# Patient Record
Sex: Female | Born: 1965 | Race: White | Hispanic: No | Marital: Married | Smoking: Former smoker
Health system: Southern US, Community
[De-identification: ages and names within clinical notes are randomized; demographics above are authoritative.]

## PROBLEM LIST (undated history)

## (undated) DIAGNOSIS — E785 Hyperlipidemia, unspecified: Secondary | ICD-10-CM

## (undated) DIAGNOSIS — M51379 Other intervertebral disc degeneration, lumbosacral region without mention of lumbar back pain or lower extremity pain: Secondary | ICD-10-CM

## (undated) DIAGNOSIS — I10 Essential (primary) hypertension: Secondary | ICD-10-CM

## (undated) DIAGNOSIS — M5137 Other intervertebral disc degeneration, lumbosacral region: Secondary | ICD-10-CM

## (undated) HISTORY — PX: ABDOMINAL HYSTERECTOMY: SHX81

## (undated) HISTORY — PX: NECK SURGERY: SHX720

## (undated) HISTORY — PX: NASAL RECONSTRUCTION WITH SEPTAL REPAIR: SHX5665

---

## 2018-04-12 ENCOUNTER — Encounter: Payer: Self-pay | Admitting: Emergency Medicine

## 2018-04-12 ENCOUNTER — Emergency Department
Admission: EM | Admit: 2018-04-12 | Discharge: 2018-04-12 | Disposition: A | Discharge status: Home / Self Care | Payer: BLUE CROSS/BLUE SHIELD | Attending: Emergency Medicine | Admitting: Emergency Medicine

## 2018-04-12 ENCOUNTER — Other Ambulatory Visit: Payer: Self-pay

## 2018-04-12 ENCOUNTER — Emergency Department: Payer: BLUE CROSS/BLUE SHIELD

## 2018-04-12 DIAGNOSIS — Y929 Unspecified place or not applicable: Secondary | ICD-10-CM | POA: Insufficient documentation

## 2018-04-12 DIAGNOSIS — Y93K1 Activity, walking an animal: Secondary | ICD-10-CM | POA: Diagnosis not present

## 2018-04-12 DIAGNOSIS — S63502A Unspecified sprain of left wrist, initial encounter: Secondary | ICD-10-CM | POA: Diagnosis not present

## 2018-04-12 DIAGNOSIS — S61512A Laceration without foreign body of left wrist, initial encounter: Secondary | ICD-10-CM

## 2018-04-12 DIAGNOSIS — Y998 Other external cause status: Secondary | ICD-10-CM | POA: Insufficient documentation

## 2018-04-12 DIAGNOSIS — I10 Essential (primary) hypertension: Secondary | ICD-10-CM | POA: Diagnosis not present

## 2018-04-12 DIAGNOSIS — W01198A Fall on same level from slipping, tripping and stumbling with subsequent striking against other object, initial encounter: Secondary | ICD-10-CM | POA: Diagnosis not present

## 2018-04-12 DIAGNOSIS — S6992XA Unspecified injury of left wrist, hand and finger(s), initial encounter: Secondary | ICD-10-CM | POA: Diagnosis present

## 2018-04-12 DIAGNOSIS — Z23 Encounter for immunization: Secondary | ICD-10-CM | POA: Insufficient documentation

## 2018-04-12 HISTORY — DX: Essential (primary) hypertension: I10

## 2018-04-12 HISTORY — DX: Hyperlipidemia, unspecified: E78.5

## 2018-04-12 MED ORDER — CEPHALEXIN 500 MG PO CAPS: 1000.0000 mg | ORAL_CAPSULE | Freq: Two times a day (BID) | ORAL | 0 refills | Status: DC

## 2018-04-12 MED ORDER — LIDOCAINE HCL (PF) 1 % IJ SOLN
10.0000 mL | Freq: Once | INTRAMUSCULAR | Status: AC
  Administered 2018-04-12: 10 mL
  Filled 2018-04-12: qty 10

## 2018-04-12 MED ORDER — TETANUS-DIPHTH-ACELL PERTUSSIS 5-2.5-18.5 LF-MCG/0.5 IM SUSP
0.5000 mL | Freq: Once | INTRAMUSCULAR | Status: AC
  Administered 2018-04-12: 0.5 mL via INTRAMUSCULAR
  Filled 2018-04-12: qty 0.5

## 2018-04-12 MED ORDER — LIDOCAINE HCL (PF) 1 % IJ SOLN
INTRAMUSCULAR | Status: AC
  Administered 2018-04-12: 10 mL
  Filled 2018-04-12: qty 5

## 2018-04-12 NOTE — ED Triage Notes (Signed)
Pt to ED from home c/o laceration to left arm while walking dog tonight.

## 2018-04-12 NOTE — ED Provider Notes (Signed)
[facility] Emergency Department Provider Note  ____________________________________________  Time seen: Approximately 9:09 PM  I have reviewed the triage vital signs and the nursing notes.   HISTORY  Chief Complaint Laceration    HPI Shannon Cortez is a 52 y.o. female who presents the emergency department complaining of injury, laceration to the left wrist.  Patient was walking her dog, on a sharp slope with wet leaves.  Dog jerked her forward causing her to fall into a "push."  Patient sustained an injury to the left wrist with laceration to the dorsal aspect.  Patient is unsure of her last tetanus shot.  She is able to move the wrist but doing so increases pain.  Patient had immediate bruising and swelling in addition to laceration.  She did not hit her head or lose consciousness.  No other injury or complaint at this time.    Past Medical History:  Diagnosis Date  . Hyperlipidemia   . Hypertension     There are no active problems to display for this patient.   Past Surgical History:  Procedure Laterality Date  . ABDOMINAL HYSTERECTOMY    . NECK SURGERY      Prior to Admission medications   Medication Sig Start Date End Date Taking? Authorizing Provider  cephALEXin (KEFLEX) 500 MG capsule Take 2 capsules (1,000 mg total) by mouth 2 (two) times daily. 04/12/18   Cuthriell, Delorise Royals, PA-C    Allergies Toradol [ketorolac tromethamine] and Sulfa antibiotics  History reviewed. No pertinent family history.  Social History Social History   Tobacco Use  . Smoking status: Never Smoker  . Smokeless tobacco: Never Used  Substance Use Topics  . Alcohol use: Never    Frequency: Never  . Drug use: Never     Review of Systems  Constitutional: No fever/chills Eyes: No visual changes. No discharge ENT: No upper respiratory complaints. Cardiovascular: no chest pain. Respiratory: no cough. No SOB. Gastrointestinal: No abdominal pain.  No  nausea, no vomiting.   Musculoskeletal: Left wrist injury Skin: Laceration to the left wrist Neurological: Negative for headaches, focal weakness or numbness. 10-point ROS otherwise negative.  ____________________________________________   PHYSICAL EXAM:  VITAL SIGNS: ED Triage Vitals [04/12/18 1955]  Enc Vitals Group     BP (!) 160/77     Pulse Rate 80     Resp 18     Temp 98 F (36.7 C)     Temp Source Oral     SpO2 99 %     Weight 172 lb (78 kg)     Height 5\' 2"  (1.575 m)     Head Circumference      Peak Flow      Pain Score 7     Pain Loc      Pain Edu?      Excl. in GC?      Constitutional: Alert and oriented. Well appearing and in no acute distress. Eyes: Conjunctivae are normal. PERRL. EOMI. Head: Atraumatic. Neck: No stridor.    Cardiovascular: Normal rate, regular rhythm. Normal S1 and S2.  Good peripheral circulation. Respiratory: Normal respiratory effort without tachypnea or retractions. Lungs CTAB. Good air entry to the bases with no decreased or absent breath sounds. Musculoskeletal: Full range of motion to all extremities. No gross deformities appreciated.  Examination of the left wrist reveals mild edema, ecchymosis to the anterior and posterior aspect of the wrist.  Patient has a laceration to the dorsal aspect of the wrist.  This is  linear nature.  Edges are smooth.  No foreign body.  No bleeding.  Laceration measures approximately 5 cm in length.  Patient is able to flex and extend the wrist.  Significant tenderness palpation of the distal radius and ulna with no palpable abnormality.  Radial pulse intact.  Sensation intact all 5 digits. Neurologic:  Normal speech and language. No gross focal neurologic deficits are appreciated.  Skin:  Skin is warm, dry and intact. No rash noted. Psychiatric: Mood and affect are normal. Speech and behavior are normal. Patient exhibits appropriate insight and judgement.   ____________________________________________    LABS (all labs ordered are listed, but only abnormal results are displayed)  Labs Reviewed - No data to display ____________________________________________  EKG   ____________________________________________  RADIOLOGY I personally viewed and evaluated these images as part of my medical decision making, as well as reviewing the written report by the radiologist.  I concur with radiologist finding of no acute osseous abnormality or retained foreign body  Dg Wrist Complete Left  Result Date: 04/12/2018 CLINICAL DATA:  Left posterior wrist pain laceration following a fall today. EXAM: LEFT WRIST - COMPLETE 3+ VIEW COMPARISON:  None. FINDINGS: Shallow linear soft tissue defect in the dorsal aspect of the wrist. Two small infused ossicles distal to the ulnar styloid. No acute fracture or dislocation. IMPRESSION: No acute fracture or dislocation. Electronically Signed   By: Beckie Salts M.D.   On: 04/12/2018 21:36    ____________________________________________    PROCEDURES  Procedure(s) performed:    Marland KitchenMarland KitchenLaceration Repair Date/Time: 04/12/2018 10:20 PM Performed by: Racheal Patches, PA-C Authorized by: Racheal Patches, PA-C   Consent:    Consent obtained:  Verbal   Consent given by:  Patient   Risks discussed:  Pain Anesthesia (see MAR for exact dosages):    Anesthesia method:  Local infiltration   Local anesthetic:  Lidocaine 1% w/o epi Laceration details:    Location:  Shoulder/arm   Shoulder/arm location:  L lower arm   Length (cm):  5 Repair type:    Repair type:  Simple Pre-procedure details:    Preparation:  Patient was prepped and draped in usual sterile fashion and imaging obtained to evaluate for foreign bodies Exploration:    Hemostasis achieved with:  Direct pressure   Wound exploration: wound explored through full range of motion and entire depth of wound probed and visualized     Wound extent: no foreign bodies/material noted, no muscle damage  noted, no nerve damage noted, no tendon damage noted, no underlying fracture noted and no vascular damage noted     Contaminated: no   Treatment:    Area cleansed with:  Betadine   Amount of cleaning:  Standard   Irrigation solution:  Sterile saline   Irrigation volume:  500 ml   Irrigation method:  Syringe Skin repair:    Repair method:  Sutures   Suture size:  4-0   Suture material:  Nylon   Suture technique:  Simple interrupted   Number of sutures:  9 Approximation:    Approximation:  Close Post-procedure details:    Dressing:  Splint for protection   Patient tolerance of procedure:  Tolerated well, no immediate complications      Medications  lidocaine (PF) (XYLOCAINE) 1 % injection 10 mL (10 mLs Infiltration Given 04/12/18 2130)  Tdap (BOOSTRIX) injection 0.5 mL (0.5 mLs Intramuscular Given 04/12/18 2129)     ____________________________________________   INITIAL IMPRESSION / ASSESSMENT AND PLAN / ED COURSE  Pertinent labs & imaging results that were available during my care of the patient were reviewed by me and considered in my medical decision making (see chart for details).  Review of the [place] CSRS was performed in accordance of the [facility] prior to dispensing any controlled drugs.      Patient's diagnosis is consistent with left wrist sprain, laceration to the left wrist.  Patient presented to the emergency department after injuring left wrist.  Wound was closed as described above.  X-ray reveals no acute osseous abnormality or retained foreign body.  Splint for protection after laceration repair.  Antibiotics prophylactically.  Tylenol and Motrin at home as needed for pain.  Follow-up primary care in 1 week for suture removal..  Patient is given ED precautions to return to the ED for any worsening or new symptoms.     ____________________________________________  FINAL CLINICAL IMPRESSION(S) / ED DIAGNOSES  Final diagnoses:  Sprain of left wrist, initial  encounter  Laceration of left wrist, initial encounter      NEW MEDICATIONS STARTED DURING THIS VISIT:  ED Discharge Orders         Ordered    cephALEXin (KEFLEX) 500 MG capsule  2 times daily     04/12/18 2215              This chart was dictated using voice recognition software/Dragon. Despite best efforts to proofread, errors can occur which can change the meaning. Any change was purely unintentional.    Racheal PatchesCuthriell, Jonathan D, PA-C 04/12/18 2222    Rockne MenghiniNorman, Anne-Caroline, MD 04/13/18 [phone]

## 2018-04-15 DIAGNOSIS — E876 Hypokalemia: Secondary | ICD-10-CM | POA: Diagnosis present

## 2018-04-15 DIAGNOSIS — Z79899 Other long term (current) drug therapy: Secondary | ICD-10-CM

## 2018-04-15 DIAGNOSIS — Y93K1 Activity, walking an animal: Secondary | ICD-10-CM

## 2018-04-15 DIAGNOSIS — L03114 Cellulitis of left upper limb: Principal | ICD-10-CM | POA: Diagnosis present

## 2018-04-15 DIAGNOSIS — S61512A Laceration without foreign body of left wrist, initial encounter: Secondary | ICD-10-CM | POA: Diagnosis present

## 2018-04-15 DIAGNOSIS — Z888 Allergy status to other drugs, medicaments and biological substances status: Secondary | ICD-10-CM

## 2018-04-15 DIAGNOSIS — T148XXA Other injury of unspecified body region, initial encounter: Secondary | ICD-10-CM | POA: Diagnosis not present

## 2018-04-15 DIAGNOSIS — M5137 Other intervertebral disc degeneration, lumbosacral region: Secondary | ICD-10-CM | POA: Diagnosis present

## 2018-04-15 DIAGNOSIS — I10 Essential (primary) hypertension: Secondary | ICD-10-CM | POA: Diagnosis present

## 2018-04-15 DIAGNOSIS — Z882 Allergy status to sulfonamides status: Secondary | ICD-10-CM

## 2018-04-15 DIAGNOSIS — W1830XA Fall on same level, unspecified, initial encounter: Secondary | ICD-10-CM | POA: Diagnosis present

## 2018-04-15 DIAGNOSIS — E785 Hyperlipidemia, unspecified: Secondary | ICD-10-CM | POA: Diagnosis present

## 2018-04-15 DIAGNOSIS — R110 Nausea: Secondary | ICD-10-CM | POA: Diagnosis not present

## 2018-04-15 LAB — CBC WITH DIFFERENTIAL/PLATELET
Abs Immature Granulocytes: 0.02 10*3/uL (ref 0.00–0.07)
Basophils Absolute: 0 10*3/uL (ref 0.0–0.1)
Basophils Relative: 1 %
Eosinophils Absolute: 0.2 10*3/uL (ref 0.0–0.5)
Eosinophils Relative: 2 %
HCT: 38.3 % (ref 36.0–46.0)
HEMOGLOBIN: 12.5 g/dL (ref 12.0–15.0)
Immature Granulocytes: 0 %
Lymphocytes Relative: 16 %
Lymphs Abs: 1.3 10*3/uL (ref 0.7–4.0)
MCH: 31.2 pg (ref 26.0–34.0)
MCHC: 32.6 g/dL (ref 30.0–36.0)
MCV: 95.5 fL (ref 80.0–100.0)
Monocytes Absolute: 0.6 10*3/uL (ref 0.1–1.0)
Monocytes Relative: 8 %
Neutro Abs: 5.8 10*3/uL (ref 1.7–7.7)
Neutrophils Relative %: 73 %
Platelets: 209 10*3/uL (ref 150–400)
RBC: 4.01 MIL/uL (ref 3.87–5.11)
RDW: 12.9 % (ref 11.5–15.5)
WBC: 8 10*3/uL (ref 4.0–10.5)
nRBC: 0 % (ref 0.0–0.2)

## 2018-04-15 LAB — COMPREHENSIVE METABOLIC PANEL
ALT: 20 U/L (ref 0–44)
AST: 22 U/L (ref 15–41)
Albumin: 4.1 g/dL (ref 3.5–5.0)
Alkaline Phosphatase: 111 U/L (ref 38–126)
Anion gap: 9 (ref 5–15)
BILIRUBIN TOTAL: 0.5 mg/dL (ref 0.3–1.2)
BUN: 13 mg/dL (ref 6–20)
CALCIUM: 8.8 mg/dL — AB (ref 8.9–10.3)
CO2: 23 mmol/L (ref 22–32)
Chloride: 108 mmol/L (ref 98–111)
Creatinine, Ser: 1.09 mg/dL — ABNORMAL HIGH (ref 0.44–1.00)
GFR calc Af Amer: >60 mL/min (ref >60)
GFR calc non Af Amer: 58 mL/min — ABNORMAL LOW (ref >60)
Glucose, Bld: 108 mg/dL — ABNORMAL HIGH (ref 70–99)
POTASSIUM: 3.4 mmol/L — AB (ref 3.5–5.1)
Sodium: 140 mmol/L (ref 135–145)
Total Protein: 7.3 g/dL (ref 6.5–8.1)

## 2018-04-15 NOTE — ED Triage Notes (Signed)
Patient c/o purulent drainage from laceration on left forearm/wrist. Laceration was repaired at this ED on Monday. Foul smell noted to area. Patient reports the pain has become more severe.

## 2018-04-16 ENCOUNTER — Inpatient Hospital Stay
Admission: EM | Admit: 2018-04-16 | Discharge: 2018-04-19 | DRG: 603 | Disposition: A | Discharge status: Home / Self Care | Payer: BLUE CROSS/BLUE SHIELD | Attending: Internal Medicine | Admitting: Internal Medicine

## 2018-04-16 ENCOUNTER — Other Ambulatory Visit: Payer: Self-pay

## 2018-04-16 DIAGNOSIS — T148XXA Other injury of unspecified body region, initial encounter: Secondary | ICD-10-CM

## 2018-04-16 DIAGNOSIS — L03114 Cellulitis of left upper limb: Secondary | ICD-10-CM

## 2018-04-16 DIAGNOSIS — L089 Local infection of the skin and subcutaneous tissue, unspecified: Secondary | ICD-10-CM

## 2018-04-16 DIAGNOSIS — L0390 Cellulitis, unspecified: Secondary | ICD-10-CM | POA: Diagnosis present

## 2018-04-16 HISTORY — DX: Other intervertebral disc degeneration, lumbosacral region without mention of lumbar back pain or lower extremity pain: M51.379

## 2018-04-16 HISTORY — DX: Other intervertebral disc degeneration, lumbosacral region: M51.37

## 2018-04-16 LAB — HEMOGLOBIN A1C
Hgb A1c MFr Bld: 4.6 % — ABNORMAL LOW (ref 4.8–5.6)
Mean Plasma Glucose: 85.32 mg/dL

## 2018-04-16 LAB — CG4 I-STAT (LACTIC ACID): Lactic Acid, Venous: 1.13 mmol/L (ref 0.5–1.9)

## 2018-04-16 MED ORDER — ONDANSETRON HCL 4 MG/2ML IJ SOLN
4.0000 mg | Freq: Once | INTRAMUSCULAR | Status: AC
  Administered 2018-04-16: 4 mg via INTRAVENOUS
  Filled 2018-04-16: qty 2

## 2018-04-16 MED ORDER — ATENOLOL 25 MG PO TABS
25.0000 mg | ORAL_TABLET | Freq: Every day | ORAL | Status: DC
  Administered 2018-04-16 – 2018-04-19 (×4): 25 mg via ORAL
  Filled 2018-04-16 (×4): qty 1

## 2018-04-16 MED ORDER — SODIUM CHLORIDE 0.9 % IV BOLUS
1000.0000 mL | Freq: Once | INTRAVENOUS | Status: AC
  Administered 2018-04-16: 1000 mL via INTRAVENOUS

## 2018-04-16 MED ORDER — CEFAZOLIN SODIUM-DEXTROSE 1-4 GM/50ML-% IV SOLN
1.0000 g | Freq: Once | INTRAVENOUS | Status: AC
  Administered 2018-04-16: 1 g via INTRAVENOUS
  Filled 2018-04-16: qty 50

## 2018-04-16 MED ORDER — SODIUM CHLORIDE 0.9 % IV SOLN
INTRAVENOUS | Status: DC | PRN
  Administered 2018-04-16 – 2018-04-18 (×2): 500 mL via INTRAVENOUS

## 2018-04-16 MED ORDER — ACETAMINOPHEN 650 MG RE SUPP: 650.0000 mg | Freq: Four times a day (QID) | RECTAL | Status: DC | PRN

## 2018-04-16 MED ORDER — SODIUM CHLORIDE 0.9 % IV SOLN
2.0000 g | INTRAVENOUS | Status: DC
  Administered 2018-04-16 – 2018-04-18 (×3): 2 g via INTRAVENOUS
  Filled 2018-04-16 (×3): qty 2
  Filled 2018-04-16: qty 20

## 2018-04-16 MED ORDER — DOCUSATE SODIUM 100 MG PO CAPS
100.0000 mg | ORAL_CAPSULE | Freq: Two times a day (BID) | ORAL | Status: DC
  Administered 2018-04-16 – 2018-04-19 (×6): 100 mg via ORAL
  Filled 2018-04-16 (×6): qty 1

## 2018-04-16 MED ORDER — OXYCODONE HCL 5 MG PO TABS
5.0000 mg | ORAL_TABLET | ORAL | Status: DC | PRN
  Administered 2018-04-16: 10 mg via ORAL
  Administered 2018-04-16: 5 mg via ORAL
  Administered 2018-04-17: 10 mg via ORAL
  Filled 2018-04-16 (×2): qty 2
  Filled 2018-04-16: qty 1

## 2018-04-16 MED ORDER — ATORVASTATIN CALCIUM 20 MG PO TABS
20.0000 mg | ORAL_TABLET | Freq: Every day | ORAL | Status: DC
  Administered 2018-04-17 – 2018-04-19 (×3): 20 mg via ORAL
  Filled 2018-04-16 (×3): qty 1

## 2018-04-16 MED ORDER — VANCOMYCIN HCL IN DEXTROSE 1-5 GM/200ML-% IV SOLN
1000.0000 mg | INTRAVENOUS | Status: DC
  Administered 2018-04-16 – 2018-04-18 (×3): 1000 mg via INTRAVENOUS
  Filled 2018-04-16 (×4): qty 200

## 2018-04-16 MED ORDER — MORPHINE SULFATE (PF) 2 MG/ML IV SOLN
2.0000 mg | INTRAVENOUS | Status: DC | PRN
  Administered 2018-04-17 (×2): 2 mg via INTRAVENOUS
  Filled 2018-04-16 (×3): qty 1

## 2018-04-16 MED ORDER — ONDANSETRON HCL 4 MG PO TABS
4.0000 mg | ORAL_TABLET | Freq: Four times a day (QID) | ORAL | Status: DC | PRN
  Administered 2018-04-17 – 2018-04-18 (×2): 4 mg via ORAL
  Filled 2018-04-16 (×2): qty 1

## 2018-04-16 MED ORDER — ACETAMINOPHEN 325 MG PO TABS
650.0000 mg | ORAL_TABLET | Freq: Four times a day (QID) | ORAL | Status: DC | PRN
  Administered 2018-04-16: 650 mg via ORAL
  Filled 2018-04-16 (×2): qty 2

## 2018-04-16 MED ORDER — ONDANSETRON HCL 4 MG/2ML IJ SOLN
4.0000 mg | Freq: Four times a day (QID) | INTRAMUSCULAR | Status: DC | PRN
  Filled 2018-04-16: qty 2

## 2018-04-16 MED ORDER — MORPHINE SULFATE (PF) 4 MG/ML IV SOLN
4.0000 mg | Freq: Once | INTRAVENOUS | Status: AC
  Administered 2018-04-16: 4 mg via INTRAVENOUS
  Filled 2018-04-16: qty 1

## 2018-04-16 MED ORDER — METHOCARBAMOL 500 MG PO TABS
500.0000 mg | ORAL_TABLET | Freq: Two times a day (BID) | ORAL | Status: DC
  Administered 2018-04-16 – 2018-04-19 (×7): 500 mg via ORAL
  Filled 2018-04-16 (×7): qty 1

## 2018-04-16 MED ORDER — VANCOMYCIN HCL IN DEXTROSE 1-5 GM/200ML-% IV SOLN
1000.0000 mg | Freq: Once | INTRAVENOUS | Status: AC
  Administered 2018-04-16: 1000 mg via INTRAVENOUS
  Filled 2018-04-16: qty 200

## 2018-04-16 MED ORDER — POTASSIUM CHLORIDE CRYS ER 20 MEQ PO TBCR
40.0000 meq | EXTENDED_RELEASE_TABLET | ORAL | Status: AC
  Administered 2018-04-16: 40 meq via ORAL
  Filled 2018-04-16: qty 2

## 2018-04-16 NOTE — Plan of Care (Signed)

## 2018-04-16 NOTE — ED Notes (Signed)
[facility] called to RN taking patient in room 12. Patient to be transferred to room 133.

## 2018-04-16 NOTE — ED Notes (Signed)
Pt with increased swelling and pain; redness noted extended from hand up into FA; charge nurse called for exam room

## 2018-04-16 NOTE — ED Provider Notes (Signed)
[facility] Emergency Department Provider Note  ____________________________________________   First MD Initiated Contact with Patient 04/16/18 [phone]     (approximate)  I have reviewed the triage vital signs and the nursing notes.   HISTORY  Chief Complaint Wound Check    HPI Shannon Cortez is a 51 y.o. female with no contributory chronic medical issues who presents for evaluation of a wound infection.  She was seen in the emergency department about 4 days ago for a laceration to the dorsal aspect of her left wrist after a fall while walking her dog.  The wound has remained closed but over the last couple of days it has become painful and is started to get red around the sutures.  As of this evening she just lifted up her arm and a large amount of pus squirted out of the wound.  It was starting to swell and the pain was becoming severe so her husband convinced her to come to the emergency department.  Due to very high volumes in the department tonight, they waited for a room for about 3-1/2 hours.  During that time the patient's arm began to swell more in the redness has spread from the left wrist to the level of the MCPs on her hand and up more than midway on her forearm.  Moving the arm and touching it make it worse and nothing particular makes it better.  She denies fever/chills, chest pain, shortness of breath, nausea, vomiting, and abdominal pain.  The pain and purulence from the arm is severe.  She received a Tdap when she was originally seen and was started on Keflex which she has been taking.  Past Medical History:  Diagnosis Date  . DDD (degenerative disc disease), lumbosacral   . Hyperlipidemia   . Hypertension     Patient Active Problem List   Diagnosis Date Noted  . Cellulitis 04/16/2018    Past Surgical History:  Procedure Laterality Date  . ABDOMINAL HYSTERECTOMY    . NASAL RECONSTRUCTION WITH SEPTAL REPAIR    . NECK SURGERY      Prior  to Admission medications   Medication Sig Start Date End Date Taking? Authorizing Provider  atenolol (TENORMIN) 25 MG tablet Take 25 mg by mouth daily.   Yes [provider]  atorvastatin (LIPITOR) 20 MG tablet Take 20 mg by mouth daily.   Yes [provider]  cephALEXin (KEFLEX) 500 MG capsule Take 2 capsules (1,000 mg total) by mouth 2 (two) times daily. 04/12/18  Yes Cuthriell, Delorise Royals, PA-C  ibuprofen (ADVIL,MOTRIN) 800 MG tablet Take 800 mg by mouth every 8 (eight) hours as needed for moderate pain.   Yes [provider]  methocarbamol (ROBAXIN) 500 MG tablet Take 500 mg by mouth 2 (two) times daily.   Yes [provider]    Allergies Toradol [ketorolac tromethamine] and Sulfa antibiotics  Family History  Problem Relation Age of Onset  . Hepatitis Mother     Social History Social History   Tobacco Use  . Smoking status: Never Smoker  . Smokeless tobacco: Never Used  Substance Use Topics  . Alcohol use: Never    Frequency: Never  . Drug use: Never    Review of Systems constitutional: No fever/chills Eyes: No visual changes. ENT: No sore throat. Cardiovascular: Denies chest pain. Respiratory: Denies shortness of breath. Gastrointestinal: No abdominal pain.  No nausea, no vomiting.  No diarrhea.  No constipation. Genitourinary: Negative for dysuria. Musculoskeletal: Pain, swelling, and  purulent drainage from the sutured wound on her left wrist as described above Integumentary: Erythema spreading throughout the left forearm and onto the left hand Neurological: Negative for headaches, focal weakness or numbness.   ____________________________________________   PHYSICAL EXAM:  VITAL SIGNS: ED Triage Vitals  Enc Vitals Group     BP 04/15/18 2302 (!) 162/85     Pulse Rate 04/15/18 2302 70     Resp 04/15/18 2302 20     Temp 04/15/18 2302 97.6 F (36.4 C)     Temp Source 04/15/18 2302 Oral     SpO2 04/15/18 2302 95 %     Weight  04/15/18 2303 79 kg (174 lb 2.6 oz)     Height --      Head Circumference --      Peak Flow --      Pain Score 04/15/18 2303 8     Pain Loc --      Pain Edu? --      Excl. in GC? --     Constitutional: Alert and oriented. Well appearing and in no acute distress. Eyes: Conjunctivae are normal.  Head: Atraumatic. Nose: No congestion/rhinnorhea. Mouth/Throat: Mucous membranes are moist. Neck: No stridor.  No meningeal signs.   Cardiovascular: Normal rate, regular rhythm. Good peripheral circulation. Grossly normal heart sounds. Respiratory: Normal respiratory effort.  No retractions. Lungs CTAB. Gastrointestinal: Soft and nontender. No distention.  Musculoskeletal: There is edema around the wound of the dorsal aspect of the left wrist and extending several centimeters proximally and distally to the hand.  See skin exam below.  In spite of the cellulitis on the hand she is able to squeeze and flex and extend her fingers without difficulty.  No evidence of flexor tenosynovitis. Neurologic:  Normal speech and language. No gross focal neurologic deficits are appreciated.  Psychiatric: Mood and affect are normal. Speech and behavior are normal. Skin:  Skin is warm and dry.  There is erythema on the dorsal aspect of the arm extending more than halfway towards the elbow on the left forearm and down to the MCPs of the the hand.  The total length of the cellulitis extends at least 20 cm on the arm.  There is no wound dehiscence, but there is purulent material draining from between the sutures.  See photo below (purulence had just been wiped away):      ____________________________________________   LABS (all labs ordered are listed, but only abnormal results are displayed)  Labs Reviewed  COMPREHENSIVE METABOLIC PANEL - Abnormal; Notable for the following components:      Result Value   Potassium 3.4 (*)    Glucose, Bld 108 (*)    Creatinine, Ser 1.09 (*)    Calcium 8.8 (*)    GFR calc  non Af Amer 58 (*)    All other components within normal limits  CULTURE, BLOOD (ROUTINE X 2)  CULTURE, BLOOD (ROUTINE X 2)  AEROBIC/ANAEROBIC CULTURE (SURGICAL/DEEP WOUND)  CBC WITH DIFFERENTIAL/PLATELET  I-STAT CG4 LACTIC ACID, ED  CG4 I-STAT (LACTIC ACID)  POC URINE PREG, ED   ____________________________________________  EKG  None - EKG not ordered by ED physician ____________________________________________  RADIOLOGY   ED MD interpretation: No indication for imaging  Official radiology report(s): No results found.  ____________________________________________   PROCEDURES  Critical Care performed: No   Procedure(s) performed:   Procedures   ____________________________________________   INITIAL IMPRESSION / ASSESSMENT AND PLAN / ED COURSE  As part of my medical decision making, I  reviewed the following data within the electronic MEDICAL RECORD NUMBER Nursing notes reviewed and incorporated, Labs reviewed , Old chart reviewed, Discussed with admitting physician  and Notes from prior ED visits    Differential diagnosis includes, but is not limited to, wound infection, cellulitis, necrotizing fasciitis.  Fortunately the patient is well-appearing and in no acute distress with normal vital signs.  She does not meet septic criteria.  Her lab work is within normal limits including no leukocytosis and a normal lactic acid.  However, her physical exam is concerning and over the last 4 hours she has gone from a localized wound infection to having spreading cellulitis covering more than 20 cm of her left arm.  Blood cultures have been sent.  I have placed an IV and am giving her Ancef 1 g IV and vancomycin 1 g IV for empiric antibiotic treatment.  Prior to antibiotic administration (although she has been on Keflex at home and this is a failure of outpatient treatment), I obtained a wound culture by removing the sutures and expressing a copious amount of purulent material from  the wound.  It was then irrigated with saline.  The patient's pain is relatively mild in circumstances and there is no crepitus.  I am not concerned about necrotizing fasciitis at this time and there is no indication for further imaging.  I discussed the case with the hospitalist for admission for IV antibiotics, failure of outpatient antibiotics, and wound consult.  The patient and her husband understand and agree with the plan.  Clinical Course as of Apr 16 545  Fri Apr 16, 2018  0410 Lactic Acid, Venous: 1.13 [CF]    Clinical Course User Index [CF] Loleta RoseForbach, Nona Gracey, MD    ____________________________________________  FINAL CLINICAL IMPRESSION(S) / ED DIAGNOSES  Final diagnoses:  Wound infection  Cellulitis of left arm     MEDICATIONS GIVEN DURING THIS VISIT:  Medications  ceFAZolin (ANCEF) IVPB 1 g/50 mL premix (1 g Intravenous New Bag/Given 04/16/18 0526)  potassium chloride SA (K-DUR,KLOR-CON) CR tablet 40 mEq (has no administration in time range)  vancomycin (VANCOCIN) IVPB 1000 mg/200 mL premix (has no administration in time range)  sodium chloride 0.9 % bolus 1,000 mL (0 mLs Intravenous Stopped 04/16/18 0523)  vancomycin (VANCOCIN) IVPB 1000 mg/200 mL premix (0 mg Intravenous Stopped 04/16/18 0521)  morphine 4 MG/ML injection 4 mg (4 mg Intravenous Given 04/16/18 0405)  ondansetron (ZOFRAN) injection 4 mg (4 mg Intravenous Given 04/16/18 0402)     ED Discharge Orders    None       Note:  This document was prepared using Dragon voice recognition software and may include unintentional dictation errors.    Loleta RoseForbach, Kamee Bobst, MD 04/16/18 [phone]

## 2018-04-16 NOTE — Progress Notes (Addendum)
Patient admitted early this morning.  Seen and examined by me later in the morning.  She feels like the redness of her left wrist has improved with antibiotics.  She endorses a lot of left wrist pain.  She failed outpatient treatment with Keflex.  On exam, dry bandage in place over left wrist.  No erythema noted of the visible skin.  -Agree with admission H&P -Continue vancomycin -Add ceftriaxone due to wound cultures growing gram negative rods -Follow-up wound cultures -Seen by orthopedic surgery, who did not recommend surgical intervention at this time  Shannon CarolKaty Dalis Beers, MD [facility]

## 2018-04-16 NOTE — Consult Note (Addendum)
SURGICAL CONSULTATION NOTE   HISTORY OF PRESENT ILLNESS (HPI):  52 y.o. female presented to [facility] ED for evaluation of left wrist wound. Patient reports felt from her feet on Monday 04/12/18 and she thinks her dog step on her hand after she felt and that's how she had the laceration. She came to ED were the wound was cleaned and closed. Yesterday she started getting significant swelling and draining purulent secretions through the wound. She refers significant pain on the wrist over the wound that extend and radiates to the distal hand, finger and proximally to the forearm. She refers pain on closing her hand and extending the wrist. Denies fever or chills.  Surgery is consulted by Dr. Sheryle Hail in this context for evaluation and management of infected wound.  PAST MEDICAL HISTORY (PMH):  Past Medical History:  Diagnosis Date  . DDD (degenerative disc disease), lumbosacral   . Hyperlipidemia   . Hypertension      PAST SURGICAL HISTORY (PSH):  Past Surgical History:  Procedure Laterality Date  . ABDOMINAL HYSTERECTOMY    . NASAL RECONSTRUCTION WITH SEPTAL REPAIR    . NECK SURGERY       MEDICATIONS:  Prior to Admission medications   Medication Sig Start Date End Date Taking? Authorizing Provider  atenolol (TENORMIN) 25 MG tablet Take 25 mg by mouth daily.   Yes [provider]  atorvastatin (LIPITOR) 20 MG tablet Take 20 mg by mouth daily.   Yes [provider]  cephALEXin (KEFLEX) 500 MG capsule Take 2 capsules (1,000 mg total) by mouth 2 (two) times daily. 04/12/18  Yes Cuthriell, Delorise Royals, PA-C  ibuprofen (ADVIL,MOTRIN) 800 MG tablet Take 800 mg by mouth every 8 (eight) hours as needed for moderate pain.   Yes [provider]  methocarbamol (ROBAXIN) 500 MG tablet Take 500 mg by mouth 2 (two) times daily.   Yes [provider]     ALLERGIES:  Allergies  Allergen Reactions  . Toradol [Ketorolac Tromethamine] Itching  . Sulfa Antibiotics Rash      SOCIAL HISTORY:  Social History   Socioeconomic History  . Marital status: Married    Spouse name: Not on file  . Number of children: Not on file  . Years of education: Not on file  . Highest education level: Not on file  Occupational History  . Not on file  Social Needs  . Financial resource strain: Not on file  . Food insecurity:    Worry: Not on file    Inability: Not on file  . Transportation needs:    Medical: Not on file    Non-medical: Not on file  Tobacco Use  . Smoking status: Never Smoker  . Smokeless tobacco: Never Used  Substance and Sexual Activity  . Alcohol use: Never    Frequency: Never  . Drug use: Never  . Sexual activity: Not on file  Lifestyle  . Physical activity:    Days per week: Not on file    Minutes per session: Not on file  . Stress: Not on file  Relationships  . Social connections:    Talks on phone: Not on file    Gets together: Not on file    Attends religious service: Not on file    Active member of club or organization: Not on file    Attends meetings of clubs or organizations: Not on file    Relationship status: Not on file  . Intimate partner violence:    Fear of current  or ex partner: Not on file    Emotionally abused: Not on file    Physically abused: Not on file    Forced sexual activity: Not on file  Other Topics Concern  . Not on file  Social History Narrative  . Not on file    The patient currently resides (home / rehab facility / nursing home): Home The patient normally is (ambulatory / bedbound): Ambulatory   FAMILY HISTORY:  Family History  Problem Relation Age of Onset  . Hepatitis Mother      REVIEW OF SYSTEMS:  Constitutional: denies weight loss, fever, chills, or sweats  Eyes: denies any other vision changes, history of eye injury  ENT: denies sore throat, hearing problems  Respiratory: denies shortness of breath, wheezing  Cardiovascular: denies chest pain, palpitations  Gastrointestinal: denies  abdominal pain, N/V, or diarrhea Genitourinary: denies burning with urination or urinary frequency Musculoskeletal: positive for pain on the left wrist Skin: positive for cellulitis and swelling of left wrist and hand  Neurological: denies any other headache, dizziness, weakness  Psychiatric: denies any other depression, anxiety   All other review of systems were negative   VITAL SIGNS:  Temp:  [97.6 F (36.4 C)-98.2 F (36.8 C)] 98.2 F (36.8 C) (12/13 0724) Pulse Rate:  [64-74] 74 (12/13 0724) Resp:  [16-20] 16 (12/13 0651) BP: (116-166)/(61-143) 124/77 (12/13 0724) SpO2:  [94 %-100 %] 94 % (12/13 0724) Weight:  [79 kg] 79 kg (12/12 2303)       Weight: 79 kg BMI (Calculated): 31.85   INTAKE/OUTPUT:  This shift: No intake/output data recorded.  Last 2 shifts: @IOLAST2SHIFTS @   PHYSICAL EXAM:  Constitutional:  -- Normal body habitus  -- Awake, alert, and oriented x3  Eyes:  -- Pupils equally round and reactive to light  -- No scleral icterus  Ear, nose, and throat:  -- No jugular venous distension  Pulmonary:  -- No crackles  -- Equal breath sounds bilaterally -- Breathing non-labored at rest Cardiovascular:  -- S1, S2 present  -- No pericardial rubs Gastrointestinal:  -- Abdomen soft, nontender, non-distended, no guarding or rebound tenderness -- No abdominal masses appreciated, pulsatile or otherwise  Musculoskeletal and Integumentary:  -- Wounds: left wrist she has two wounds that superficially does not has significant cellulitis. There is swelling and tenderness to palpation on the left wrist and the hand. There is pain on passive flexion and extension of the left wrist. There is pain closing her hand.  -- Extremities: Left upper extremity as described on musculoskeletal. The rest of the extremities are normal.   Neurologic:  -- Motor function: intact and symmetric -- Sensation: intact and symmetric   Labs:  CBC Latest Ref Rng & Units 04/15/2018  WBC 4.0 -  10.5 K/uL 8.0  Hemoglobin 12.0 - 15.0 g/dL 78.2  Hematocrit 95.6 - 46.0 % 38.3  Platelets 150 - 400 K/uL 209   CMP Latest Ref Rng & Units 04/15/2018  Glucose 70 - 99 mg/dL 213(Y)  BUN 6 - 20 mg/dL 13  Creatinine 8.65 - 7.84 mg/dL 6.96(E)  Sodium 952 - 841 mmol/L 140  Potassium 3.5 - 5.1 mmol/L 3.4(L)  Chloride 98 - 111 mmol/L 108  CO2 22 - 32 mmol/L 23  Calcium 8.9 - 10.3 mg/dL 3.2(G)  Total Protein 6.5 - 8.1 g/dL 7.3  Total Bilirubin 0.3 - 1.2 mg/dL 0.5  Alkaline Phos 38 - 126 U/L 111  AST 15 - 41 U/L 22  ALT 0 - 44 U/L 20  Imaging studies:  EXAM: LEFT WRIST - COMPLETE 3+ VIEW  COMPARISON:  None.  FINDINGS: Shallow linear soft tissue defect in the dorsal aspect of the wrist. Two small infused ossicles distal to the ulnar styloid. No acute fracture or dislocation.  IMPRESSION: No acute fracture or dislocation.   Electronically Signed   By: Beckie SaltsSteven  Reid M.D.   On: 04/12/2018 21:36  Assessment/Plan:  52 y.o. female with left wrist infected wound, complicated by pertinent comorbidities including hypertension and hyperlipidemia. Upon evaluation, the patient has a left wrist laceration that was closed on the day of the trauma and got infected. The wound was opened in the ED and significant amount of pus was drained. Superficially, the patient seem to have responded to the opening of the wound and the dose of IV antibiotic, but I am concerned of a deeper infection of extensor tenosynovitis. This wound was closed for 4 days and the pus started to drain yesterday. With the physical finding of pain on extending the wrist and moving the fingers, I consider that the patient should be evaluation by Ortho to rule out tenosynovitis. If tenosynovitis is ruled out I will continue following the patient and helping with the management of the infected wound. Agree with Vancomycin. If patient does not respond to current antibiotic therapy, consider changing to a third generation  cephalosporin. I oriented the patient to keep the left upper extremity elevated resting with a pillow. Anti inflammatory medications can help to to improve inflammation.  No fracture was seen in the xray.   All of the above findings and recommendations were discussed with the patient and her husband who was bedside, and all questions were answered to their expressed satisfaction.   Gae GallopEdgardo Cintrn-Daz, MD

## 2018-04-16 NOTE — H&P (Addendum)
Shannon Cortez is an 52 y.o. female.   Chief Complaint: Wound drainage HPI: Patient with past medical history of hypertension and hyperlipidemia presents to the emergency department for recheck of the wound that was sutured 4 days ago.  The patient was having a dressing change and when her husband tried to place her stabilizer brace on the affected wrist she reports a small explosion of pus.  The patient reports that she has been nauseous and has had some chills but denies fever or vomiting.  In the emergency department the patient's wrist was oozing a mixture of pus and blood and her hand was swollen to just below the knuckles.  The wound was cultured and sutures removed prior to initiation of antibiotics.  The hospitalist staff was called for further management  Past Medical History:  Diagnosis Date  . DDD (degenerative disc disease), lumbosacral   . Hyperlipidemia   . Hypertension     Past Surgical History:  Procedure Laterality Date  . ABDOMINAL HYSTERECTOMY    . NASAL RECONSTRUCTION WITH SEPTAL REPAIR    . NECK SURGERY      Family History  Problem Relation Age of Onset  . Hepatitis Mother    Social History:  reports that she has never smoked. She has never used smokeless tobacco. She reports that she does not drink alcohol or use drugs.  Allergies:  Allergies  Allergen Reactions  . Toradol [Ketorolac Tromethamine] Itching  . Sulfa Antibiotics Rash    Medications Prior to Admission  Medication Sig Dispense Refill  . atenolol (TENORMIN) 25 MG tablet Take 25 mg by mouth daily.    Marland Kitchen atorvastatin (LIPITOR) 20 MG tablet Take 20 mg by mouth daily.    . cephALEXin (KEFLEX) 500 MG capsule Take 2 capsules (1,000 mg total) by mouth 2 (two) times daily. 28 capsule 0  . ibuprofen (ADVIL,MOTRIN) 800 MG tablet Take 800 mg by mouth every 8 (eight) hours as needed for moderate pain.    . methocarbamol (ROBAXIN) 500 MG tablet Take 500 mg by mouth 2 (two) times daily.      Results for  orders placed or performed during the hospital encounter of 04/16/18 (from the past 48 hour(s))  Comprehensive metabolic panel     Status: Abnormal   Collection Time: 04/15/18 11:13 PM  Result Value Ref Range   Sodium 140 135 - 145 mmol/L   Potassium 3.4 (L) 3.5 - 5.1 mmol/L   Chloride 108 98 - 111 mmol/L   CO2 23 22 - 32 mmol/L   Glucose, Bld 108 (H) 70 - 99 mg/dL   BUN 13 6 - 20 mg/dL   Creatinine, Ser 4.09 (H) 0.44 - 1.00 mg/dL   Calcium 8.8 (L) 8.9 - 10.3 mg/dL   Total Protein 7.3 6.5 - 8.1 g/dL   Albumin 4.1 3.5 - 5.0 g/dL   AST 22 15 - 41 U/L   ALT 20 0 - 44 U/L   Alkaline Phosphatase 111 38 - 126 U/L   Total Bilirubin 0.5 0.3 - 1.2 mg/dL   GFR calc non Af Amer 58 (L) >60 mL/min   GFR calc Af Amer >60 >60 mL/min   Anion gap 9 5 - 15    Comment: Performed at [facility], [address] [place], [place] [address]  CBC with Differential     Status: None   Collection Time: 04/15/18 11:13 PM  Result Value Ref Range   WBC 8.0 4.0 - 10.5 K/uL   RBC 4.01 3.87 -  5.11 MIL/uL   Hemoglobin 12.5 12.0 - 15.0 g/dL   HCT 16.138.3 09.636.0 - 04.546.0 %   MCV 95.5 80.0 - 100.0 fL   MCH 31.2 26.0 - 34.0 pg   MCHC 32.6 30.0 - 36.0 g/dL   RDW 40.912.9 81.111.5 - 91.415.5 %   Platelets 209 150 - 400 K/uL   nRBC 0.0 0.0 - 0.2 %   Neutrophils Relative % 73 %   Neutro Abs 5.8 1.7 - 7.7 K/uL   Lymphocytes Relative 16 %   Lymphs Abs 1.3 0.7 - 4.0 K/uL   Monocytes Relative 8 %   Monocytes Absolute 0.6 0.1 - 1.0 K/uL   Eosinophils Relative 2 %   Eosinophils Absolute 0.2 0.0 - 0.5 K/uL   Basophils Relative 1 %   Basophils Absolute 0.0 0.0 - 0.1 K/uL   Immature Granulocytes 0 %   Abs Immature Granulocytes 0.02 0.00 - 0.07 K/uL    Comment: Performed at [facility], [address] [place], [place] [address]  Blood culture (routine x 2)     Status: None (Preliminary result)   Collection Time: 04/15/18 11:13 PM  Result Value Ref Range   Specimen Description BLOOD RIGHT HAND    Special Requests       BOTTLES DRAWN AEROBIC AND ANAEROBIC Blood Culture adequate volume   Culture      NO GROWTH < 12 HOURS Performed at [facility], [address] [place], [place] [address]    Report Status PENDING   CG4 I-STAT (Lactic acid)     Status: None   Collection Time: 04/16/18  4:03 AM  Result Value Ref Range   Lactic Acid, Venous 1.13 0.5 - 1.9 mmol/L   No results found.  Review of Systems  Constitutional: Negative for chills and fever.  HENT: Negative for sore throat and tinnitus.   Eyes: Negative for blurred vision and redness.  Respiratory: Negative for cough and shortness of breath.   Cardiovascular: Negative for chest pain, palpitations, orthopnea and PND.  Gastrointestinal: Positive for nausea. Negative for abdominal pain, diarrhea and vomiting.  Genitourinary: Negative for dysuria, frequency and urgency.  Musculoskeletal: Negative for joint pain and myalgias.  Skin: Negative for rash.       No lesions  Neurological: Negative for speech change, focal weakness and weakness.  Endo/Heme/Allergies: Does not bruise/bleed easily.       No temperature intolerance  Psychiatric/Behavioral: Negative for depression and suicidal ideas.    Blood pressure 124/77, pulse 74, temperature 98.2 F (36.8 C), temperature source Oral, resp. rate 16, weight 79 kg, SpO2 94 %. Physical Exam  Vitals reviewed. Constitutional: She is oriented to person, place, and time. She appears well-developed and well-nourished. No distress.  HENT:  Head: Normocephalic and atraumatic.  Mouth/Throat: Oropharynx is clear and moist.  Eyes: Pupils are equal, round, and reactive to light. Conjunctivae and EOM are normal. No scleral icterus.  Neck: Normal range of motion. Neck supple. No JVD present. No tracheal deviation present. No thyromegaly present.  Cardiovascular: Normal rate, regular rhythm and normal heart sounds. Exam reveals no gallop and no friction rub.  No murmur heard. Respiratory: Effort  normal and breath sounds normal.  GI: Soft. Bowel sounds are normal. She exhibits no distension. There is no abdominal tenderness.  Genitourinary:    Genitourinary Comments: Deferred   Musculoskeletal: Normal range of motion.        General: No edema.  Lymphadenopathy:    She has no cervical adenopathy.  Neurological: She  is alert and oriented to person, place, and time. No cranial nerve deficit. She exhibits normal muscle tone.  Skin: Skin is warm and dry. No rash noted. No erythema.  Psychiatric: She has a normal mood and affect. Her behavior is normal. Judgment and thought content normal.     Assessment/Plan This is a 52 year old female admitted for cellulitis. 1.  Cellulitis: Left wrist; sutures removed and purulent drainage expressed. No signs or symptoms of sepsis.  Continue Ancef and vancomycin. 2.  Hypertension: Controlled; continue atenolol 3.  Hyperlipidemia: Continue statin therapy 4.  Hypokalemia: Replete potassium. 5.  DVT prophylaxis: The patient is ambulatory 6.  GI prophylaxis: None The patient is a full code.  Time spent on admission orders and patient care approximately 45 minutes  Arnaldo Natal, MD 04/16/2018, 7:40 AM

## 2018-04-16 NOTE — Consult Note (Signed)
ORTHOPAEDIC CONSULTATION  PATIENT NAME: Shannon Cortez DOB: 1965-11-28  MRN: [id number]  REQUESTING PHYSICIAN: Mayo, Allyn Kenner, MD  Chief Complaint: Left hand and wrist pain  HPI: Shannon Cortez is a 52 y.o. right-hand-dominant female who sustained a fall 4 days ago and sustained a laceration to the dorsum of the left wrist/forearm.  She presented to [facility] Emergency Department at which time the lacerations were cleaned and sutured.  She received tetanus prophylaxis at that time and was started on Keflex twice daily.  Yesterday her husband was changing the dressing in had frank pus expressed from the laceration site.  The patient denies any fevers but has had some chills and has felt somewhat nauseous.  She noted that redness was extending towards the hand and up the dorsum of the forearm last night when she presented to the Emergency Department.  The erythema has improved since the initiation of IV antibiotics.  Past Medical History:  Diagnosis Date  . DDD (degenerative disc disease), lumbosacral   . Hyperlipidemia   . Hypertension    Past Surgical History:  Procedure Laterality Date  . ABDOMINAL HYSTERECTOMY    . NASAL RECONSTRUCTION WITH SEPTAL REPAIR    . NECK SURGERY     Social History   Socioeconomic History  . Marital status: Married    Spouse name: Not on file  . Number of children: Not on file  . Years of education: Not on file  . Highest education level: Not on file  Occupational History  . Not on file  Social Needs  . Financial resource strain: Not on file  . Food insecurity:    Worry: Not on file    Inability: Not on file  . Transportation needs:    Medical: Not on file    Non-medical: Not on file  Tobacco Use  . Smoking status: Never Smoker  . Smokeless tobacco: Never Used  Substance and Sexual Activity  . Alcohol use: Never    Frequency: Never  . Drug use: Never  . Sexual activity: Not on file  Lifestyle  . Physical activity:    Days per  week: Not on file    Minutes per session: Not on file  . Stress: Not on file  Relationships  . Social connections:    Talks on phone: Not on file    Gets together: Not on file    Attends religious service: Not on file    Active member of club or organization: Not on file    Attends meetings of clubs or organizations: Not on file    Relationship status: Not on file  Other Topics Concern  . Not on file  Social History Narrative  . Not on file   Family History  Problem Relation Age of Onset  . Hepatitis Mother    Allergies  Allergen Reactions  . Toradol [Ketorolac Tromethamine] Itching  . Sulfa Antibiotics Rash   Prior to Admission medications   Medication Sig Start Date End Date Taking? Authorizing Provider  atenolol (TENORMIN) 25 MG tablet Take 25 mg by mouth daily.   Yes [provider]  atorvastatin (LIPITOR) 20 MG tablet Take 20 mg by mouth daily.   Yes [provider]  cephALEXin (KEFLEX) 500 MG capsule Take 2 capsules (1,000 mg total) by mouth 2 (two) times daily. 04/12/18  Yes Cuthriell, Delorise Royals, PA-C  ibuprofen (ADVIL,MOTRIN) 800 MG tablet Take 800 mg by mouth every 8 (eight) hours as needed for moderate pain.   Yes [provider]  methocarbamol (ROBAXIN) 500 MG tablet Take 500 mg by mouth 2 (two) times daily.   Yes [provider]   No results found.  Positive ROS: All other systems have been reviewed and were otherwise negative with the exception of those mentioned in the HPI and as above.  Physical Exam: General: Well developed, well nourished female seen in no acute distress. Skin: 2 horizontal lacerations are noted proximal to the dorsal wrist crease.  Slight erythema is noted to the site.  There is also some soft tissue swelling and ecchymosis to the volar surface of the wrist. Neurologic: Awake, alert, and oriented. Sensory function is grossly intact. Motor strength is felt to be 5 over 5 bilaterally. No clonus or tremor.  Good motor coordination. Lymphatic: No axillary or cervical lymphadenopathy  MUSCULOSKELETAL: Examination of the left upper extremity shows only minimal erythema near the lacerations/abrasions.  Gentle wrist flexion and extension is well-tolerated.  The patient demonstrates reasonably good range of motion of the digits actively.  Some difficulty is noted with attempts at creating a clenched fist.  There is tenderness to palpation on the volar surface of the wrist in the area of ecchymosis.  Scant drainage was noted on the dressing.  Assessment: Left volar wrist/distal forearm laceration with cellulitis, improving on antibiotics  Plan: The findings were discussed in detail with the patient.  The erythema has significantly improved when compared to pictures from last night.  I recommend continuation of the IV antibiotics pending final culture reports.  At this point the wound has decompressed with the removal of the sutures.  We will defer any type of surgical intervention at this time.  A large bulky hand dressing was applied.  James P. Angie FavaHooten, Jr. M.D.

## 2018-04-16 NOTE — ED Notes (Signed)
Patient fell Monday and was seen here and received 9 stiches. Area has become red with drainage with more pain. Patient states no fevers.

## 2018-04-16 NOTE — Progress Notes (Signed)
Pharmacy Antibiotic Note  Shannon Cortez is a 52 y.o. female admitted on 04/16/2018 with wound infection.  Pharmacy has been consulted for vancomycin dosing.  Plan: DW 62kg   VD 43L kei 0.053 hr-1  T1/2 13 hours Vancomycin 1 gram q 18 hours ordered with stacked dosing. Level before 5th dose. Goal trough 15-20  Weight: 174 lb 2.6 oz (79 kg)  Temp (24hrs), Avg:97.6 F (36.4 C), Min:97.6 F (36.4 C), Max:97.6 F (36.4 C)  Recent Labs  Lab 04/15/18 2313 04/16/18 0403  WBC 8.0  --   CREATININE 1.09*  --   LATICACIDVEN  --  1.13    Estimated Creatinine Clearance: 58.1 mL/min (A) (by C-G formula based on SCr of 1.09 mg/dL (H)).    Allergies  Allergen Reactions  . Toradol [Ketorolac Tromethamine] Itching  . Sulfa Antibiotics Rash    Antimicrobials this admission: Cefazolin, vancomycin 12/13 >>    >>   Dose adjustments this admission:   Microbiology results: 12/12 BCx: pending 12/13 WoundCx: pending    Thank you for allowing pharmacy to be a part of this patient's care.  Taniyah Ballow S 04/16/2018 5:34 AM

## 2018-04-17 DIAGNOSIS — Z79899 Other long term (current) drug therapy: Secondary | ICD-10-CM | POA: Diagnosis not present

## 2018-04-17 DIAGNOSIS — E876 Hypokalemia: Secondary | ICD-10-CM | POA: Diagnosis present

## 2018-04-17 DIAGNOSIS — I10 Essential (primary) hypertension: Secondary | ICD-10-CM | POA: Diagnosis present

## 2018-04-17 DIAGNOSIS — M5137 Other intervertebral disc degeneration, lumbosacral region: Secondary | ICD-10-CM | POA: Diagnosis present

## 2018-04-17 DIAGNOSIS — T148XXA Other injury of unspecified body region, initial encounter: Secondary | ICD-10-CM | POA: Diagnosis present

## 2018-04-17 DIAGNOSIS — Z888 Allergy status to other drugs, medicaments and biological substances status: Secondary | ICD-10-CM | POA: Diagnosis not present

## 2018-04-17 DIAGNOSIS — L03114 Cellulitis of left upper limb: Secondary | ICD-10-CM | POA: Diagnosis present

## 2018-04-17 DIAGNOSIS — E785 Hyperlipidemia, unspecified: Secondary | ICD-10-CM | POA: Diagnosis present

## 2018-04-17 DIAGNOSIS — S61512A Laceration without foreign body of left wrist, initial encounter: Secondary | ICD-10-CM | POA: Diagnosis present

## 2018-04-17 DIAGNOSIS — W1830XA Fall on same level, unspecified, initial encounter: Secondary | ICD-10-CM | POA: Diagnosis present

## 2018-04-17 DIAGNOSIS — R110 Nausea: Secondary | ICD-10-CM | POA: Diagnosis not present

## 2018-04-17 DIAGNOSIS — Y93K1 Activity, walking an animal: Secondary | ICD-10-CM | POA: Diagnosis not present

## 2018-04-17 DIAGNOSIS — Z882 Allergy status to sulfonamides status: Secondary | ICD-10-CM | POA: Diagnosis not present

## 2018-04-17 LAB — BASIC METABOLIC PANEL
Anion gap: 8 (ref 5–15)
BUN: 11 mg/dL (ref 6–20)
CO2: 25 mmol/L (ref 22–32)
Calcium: 8.5 mg/dL — ABNORMAL LOW (ref 8.9–10.3)
Chloride: 106 mmol/L (ref 98–111)
Creatinine, Ser: 0.95 mg/dL (ref 0.44–1.00)
GFR calc Af Amer: >60 mL/min (ref >60)
GFR calc non Af Amer: >60 mL/min (ref >60)
Glucose, Bld: 114 mg/dL — ABNORMAL HIGH (ref 70–99)
Potassium: 3.5 mmol/L (ref 3.5–5.1)
Sodium: 139 mmol/L (ref 135–145)

## 2018-04-17 LAB — CBC
HEMATOCRIT: 38 % (ref 36.0–46.0)
Hemoglobin: 12.2 g/dL (ref 12.0–15.0)
MCH: 31.1 pg (ref 26.0–34.0)
MCHC: 32.1 g/dL (ref 30.0–36.0)
MCV: 96.9 fL (ref 80.0–100.0)
Platelets: 219 10*3/uL (ref 150–400)
RBC: 3.92 MIL/uL (ref 3.87–5.11)
RDW: 13.2 % (ref 11.5–15.5)
WBC: 4.7 10*3/uL (ref 4.0–10.5)
nRBC: 0 % (ref 0.0–0.2)

## 2018-04-17 MED ORDER — OXYCODONE-ACETAMINOPHEN 5-325 MG PO TABS
1.0000 | ORAL_TABLET | Freq: Four times a day (QID) | ORAL | Status: DC | PRN
  Administered 2018-04-17 – 2018-04-19 (×7): 1 via ORAL
  Filled 2018-04-17 (×7): qty 1

## 2018-04-17 MED ORDER — ENOXAPARIN SODIUM 40 MG/0.4ML ~~LOC~~ SOLN
40.0000 mg | SUBCUTANEOUS | Status: DC
  Administered 2018-04-17 – 2018-04-18 (×2): 40 mg via SUBCUTANEOUS
  Filled 2018-04-17 (×2): qty 0.4

## 2018-04-17 MED ORDER — MORPHINE SULFATE (PF) 2 MG/ML IV SOLN: 2.0000 mg | INTRAVENOUS | Status: DC | PRN

## 2018-04-17 MED ORDER — CELECOXIB 100 MG PO CAPS
100.0000 mg | ORAL_CAPSULE | Freq: Two times a day (BID) | ORAL | Status: DC
  Administered 2018-04-17 – 2018-04-18 (×3): 100 mg via ORAL
  Filled 2018-04-17 (×6): qty 1

## 2018-04-17 NOTE — Consult Note (Signed)
ORTHOPAEDICS: The patient still complains of some hand pain but reports improvement with her finger range of motion.  Afebrile.  Vital signs are stable. The left hand and wrist dressing was changed today.  Scant drainage was noted.  Minimal erythema.  Significant improvement with swelling to the hand and digits.  Skin creases are now evident.  The patient is able to make a clenched fist.  Blood cultures are negative thus far.  Culture from the wound is still pending.  Impression: Left hand/wrist laceration with cellulitis, improving  Plan: Continue with antibiotics pending culture results. A new bulky dressing was applied.  Continue with elevation. Will add Celebrex 100 mg twice daily.  James P. Angie FavaHooten, Jr. M.D.

## 2018-04-17 NOTE — Progress Notes (Signed)
SURGICAL PROGRESS NOTE   Hospital Day(s): 0.   Post op day(s):  Marland Kitchen.   Interval History: Patient seen and examined, no acute events or new complaints overnight. Patient reports the hand is feeling a little bit better. Refers mild nausea. Denies fever or chills.   Vital signs in last 24 hours: [min-max] current  Temp:  [98.3 F (36.8 C)-98.4 F (36.9 C)] 98.4 F (36.9 C) (12/14 0815) Pulse Rate:  [75] 75 (12/14 0815) BP: (103-109)/(59-65) 109/59 (12/14 0815) SpO2:  [95 %-100 %] 95 % (12/14 0815) Weight:  [78.2 kg] 78.2 kg (12/14 1011)     Height: 5\' 2"  (157.5 cm) Weight: 78.2 kg BMI (Calculated): 31.52   Physical Exam:  Constitutional: alert, cooperative and no distress  Extremity: hand and forearm covered with bulky dressing changed today by Dr. Ernest PineHooten. Able to close fist.   Labs:  CBC Latest Ref Rng & Units 04/17/2018 04/15/2018  WBC 4.0 - 10.5 K/uL 4.7 8.0  Hemoglobin 12.0 - 15.0 g/dL 16.112.2 09.612.5  Hematocrit 04.536.0 - 46.0 % 38.0 38.3  Platelets 150 - 400 K/uL 219 209   CMP Latest Ref Rng & Units 04/17/2018 04/15/2018  Glucose 70 - 99 mg/dL 409(W114(H) 119(J108(H)  BUN 6 - 20 mg/dL 11 13  Creatinine 4.780.44 - 1.00 mg/dL 2.950.95 6.21(H1.09(H)  Sodium 086135 - 145 mmol/L 139 140  Potassium 3.5 - 5.1 mmol/L 3.5 3.4(L)  Chloride 98 - 111 mmol/L 106 108  CO2 22 - 32 mmol/L 25 23  Calcium 8.9 - 10.3 mg/dL 5.7(Q8.5(L) 4.6(N8.8(L)  Total Protein 6.5 - 8.1 g/dL - 7.3  Total Bilirubin 0.3 - 1.2 mg/dL - 0.5  Alkaline Phos 38 - 126 U/L - 111  AST 15 - 41 U/L - 22  ALT 0 - 44 U/L - 20    Imaging studies: No new pertinent imaging studies   Assessment/Plan:  52 y.o. female with left wrist infected wound, complicated by pertinent comorbidities including hypertension and hyperlipidemia. Patient evaluated by Ortho that does not mention tenosynovitis. I agree with current management. I did not evaluated the wound as the dressing was personally changed by Dr. Ernest PineHooten who assessed the wound is improving. Will stay in case if  my assistance is needed.   Gae GallopEdgardo Cintrn-Daz, MD

## 2018-04-17 NOTE — Plan of Care (Signed)
  Problem: Pain Managment: Goal: General experience of comfort will improve Outcome: Progressing   Problem: Safety: Goal: Ability to remain free from injury will improve Outcome: Progressing   Problem: Skin Integrity: Goal: Risk for impaired skin integrity will decrease Outcome: Progressing   

## 2018-04-17 NOTE — Progress Notes (Signed)
[facility] - [place] at [facility]   PATIENT NAME: Shannon Cortez    MR#:  [id number]  DATE OF BIRTH:  09/20/1965  SUBJECTIVE:  CHIEF COMPLAINT:   Chief Complaint  Patient presents with  . Wound Check   Complains of pain left wrist  REVIEW OF SYSTEMS:    Review of Systems  Constitutional: Negative for chills and fever.  HENT: Negative for sore throat.   Eyes: Negative for blurred vision, double vision and pain.  Respiratory: Negative for cough, hemoptysis, shortness of breath and wheezing.   Cardiovascular: Negative for chest pain, palpitations, orthopnea and leg swelling.  Gastrointestinal: Negative for abdominal pain, constipation, diarrhea, heartburn, nausea and vomiting.  Genitourinary: Negative for dysuria and hematuria.  Musculoskeletal: Positive for joint pain. Negative for back pain.  Skin: Negative for rash.  Neurological: Negative for sensory change, speech change, focal weakness and headaches.  Endo/Heme/Allergies: Does not bruise/bleed easily.  Psychiatric/Behavioral: Negative for depression. The patient is not nervous/anxious.     DRUG ALLERGIES:   Allergies  Allergen Reactions  . Toradol [Ketorolac Tromethamine] Itching  . Sulfa Antibiotics Rash    VITALS:  Blood pressure (!) 109/59, pulse 75, temperature 98.4 F (36.9 C), temperature source Oral, resp. rate 16, height 5\' 2"  (1.575 m), weight 78.2 kg, SpO2 95 %.  PHYSICAL EXAMINATION:   Physical Exam  GENERAL:  52 y.o.-year-old patient lying in the bed with no acute distress.  EYES: Pupils equal, round, reactive to light and accommodation. No scleral icterus. Extraocular muscles intact.  HEENT: Head atraumatic, normocephalic. Oropharynx and nasopharynx clear.  NECK:  Supple, no jugular venous distention. No thyroid enlargement, no tenderness.  LUNGS: Normal breath sounds bilaterally, no wheezing, rales, rhonchi. No use of accessory muscles of respiration.  CARDIOVASCULAR: S1, S2  normal. No murmurs, rubs, or gallops.  ABDOMEN: Soft, nontender, nondistended. Bowel sounds present. No organomegaly or mass.  EXTREMITIES: No cyanosis, clubbing or edema b/l.    Left wrist dressing NEUROLOGIC: Cranial nerves II through XII are intact. No focal Motor or sensory deficits b/l.   PSYCHIATRIC: The patient is alert and oriented x 3.  SKIN: No obvious rash, lesion, or ulcer.   LABORATORY PANEL:   CBC Recent Labs  Lab 04/17/18 0418  WBC 4.7  HGB 12.2  HCT 38.0  PLT 219   ------------------------------------------------------------------------------------------------------------------ Chemistries  Recent Labs  Lab 04/15/18 2313 04/17/18 0418  NA 140 139  K 3.4* 3.5  CL 108 106  CO2 23 25  GLUCOSE 108* 114*  BUN 13 11  CREATININE 1.09* 0.95  CALCIUM 8.8* 8.5*  AST 22  --   ALT 20  --   ALKPHOS 111  --   BILITOT 0.5  --    ------------------------------------------------------------------------------------------------------------------  Cardiac Enzymes No results for input(s): TROPONINI in the last 168 hours. ------------------------------------------------------------------------------------------------------------------  RADIOLOGY:  No results found.   ASSESSMENT AND PLAN:   This is a 52 year old female admitted for cellulitis.  *  Left wrist laceration with cellulitis Continue IV antibiotics.  Cultures pending. Pain medications added. Appreciate orthopedic input  *  Hypertension: Controlled; continue atenolol  * Hyperlipidemia: Continue statin therapy  * Hypokalemia Replaced  *  DVT prophylaxis Add Lovenox  All the records are reviewed and case discussed with Care Management/Social Worker Management plans discussed with the patient, family and they are in agreement.  CODE STATUS: FULL CODE  DVT Prophylaxis: SCDs  TOTAL TIME TAKING CARE OF THIS PATIENT: 35 minutes.   POSSIBLE D/C IN 1-2 DAYS, DEPENDING  ON CLINICAL  CONDITION.  Orie Fisherman M.D on 04/17/2018 at 12:14 PM  Between 7am to 6pm - Pager - [phone]  After 6pm go to [email or web address] - password [email or web address]  [facility] [place] [facility]  Office  [phone]  CC: Primary care physician; System, Pcp Not In  Note: This dictation was prepared with Dragon dictation along with smaller phrase technology. Any transcriptional errors that result from this process are unintentional.

## 2018-04-18 LAB — LIPASE, BLOOD: Lipase: 33 U/L (ref 11–51)

## 2018-04-18 LAB — COMPREHENSIVE METABOLIC PANEL
ALT: 21 U/L (ref 0–44)
AST: 22 U/L (ref 15–41)
Albumin: 3.6 g/dL (ref 3.5–5.0)
Alkaline Phosphatase: 94 U/L (ref 38–126)
Anion gap: 7 (ref 5–15)
BUN: 13 mg/dL (ref 6–20)
CO2: 26 mmol/L (ref 22–32)
Calcium: 8.7 mg/dL — ABNORMAL LOW (ref 8.9–10.3)
Chloride: 106 mmol/L (ref 98–111)
Creatinine, Ser: 0.88 mg/dL (ref 0.44–1.00)
GFR calc Af Amer: >60 mL/min (ref >60)
Glucose, Bld: 103 mg/dL — ABNORMAL HIGH (ref 70–99)
Potassium: 3.6 mmol/L (ref 3.5–5.1)
Sodium: 139 mmol/L (ref 135–145)
Total Bilirubin: 0.4 mg/dL (ref 0.3–1.2)
Total Protein: 6.6 g/dL (ref 6.5–8.1)

## 2018-04-18 MED ORDER — POLYETHYLENE GLYCOL 3350 17 G PO PACK: 17.0000 g | PACK | Freq: Every day | ORAL | Status: DC | PRN

## 2018-04-18 MED ORDER — RISAQUAD PO CAPS
1.0000 | ORAL_CAPSULE | Freq: Every day | ORAL | Status: DC
  Administered 2018-04-18 – 2018-04-19 (×2): 1 via ORAL
  Filled 2018-04-18 (×2): qty 1

## 2018-04-18 MED ORDER — PANTOPRAZOLE SODIUM 40 MG PO TBEC
40.0000 mg | DELAYED_RELEASE_TABLET | Freq: Two times a day (BID) | ORAL | Status: DC
  Administered 2018-04-18 – 2018-04-19 (×2): 40 mg via ORAL
  Filled 2018-04-18 (×2): qty 1

## 2018-04-18 MED ORDER — SENNOSIDES-DOCUSATE SODIUM 8.6-50 MG PO TABS
2.0000 | ORAL_TABLET | Freq: Two times a day (BID) | ORAL | Status: DC
  Administered 2018-04-18 – 2018-04-19 (×3): 2 via ORAL
  Filled 2018-04-18 (×3): qty 2

## 2018-04-18 NOTE — Consult Note (Signed)
ORTHOPAEDICS: The patient still complains of some nausea this AM. Hand tenderness has improved.  Afebrile.  Vital signs are stable. The left hand and wrist dressing was changed again today.  No drainage was apparent on the dressing.  Minimal erythema.  Minimal swelling to the hand and digits. The patient is able to make a clenched fist.  Blood cultures are negative thus far.   Culture from the wound is negative thus far..  Impression: Left hand/wrist laceration with cellulitis, improving  Plan: Continue with antibiotics pending culture results. A new bulky dressing was applied.  Continue with elevation. Will add Probiotic.Illene Labrador.  Shannon Cortez, Jr. M.D.

## 2018-04-18 NOTE — Progress Notes (Addendum)
[facility] - [place] at [facility]   PATIENT NAME: Shannon Cortez    MR#:  [id number]  DATE OF BIRTH:  10/01/1965  SUBJECTIVE:  CHIEF COMPLAINT:   Chief Complaint  Patient presents with  . Wound Check   Complains of pain left wrist. Some nausea today  REVIEW OF SYSTEMS:    Review of Systems  Constitutional: Negative for chills and fever.  HENT: Negative for sore throat.   Eyes: Negative for blurred vision, double vision and pain.  Respiratory: Negative for cough, hemoptysis, shortness of breath and wheezing.   Cardiovascular: Negative for chest pain, palpitations, orthopnea and leg swelling.  Gastrointestinal: Negative for abdominal pain, constipation, diarrhea, heartburn, nausea and vomiting.  Genitourinary: Negative for dysuria and hematuria.  Musculoskeletal: Positive for joint pain. Negative for back pain.  Skin: Negative for rash.  Neurological: Negative for sensory change, speech change, focal weakness and headaches.  Endo/Heme/Allergies: Does not bruise/bleed easily.  Psychiatric/Behavioral: Negative for depression. The patient is not nervous/anxious.     DRUG ALLERGIES:   Allergies  Allergen Reactions  . Toradol [Ketorolac Tromethamine] Itching  . Sulfa Antibiotics Rash    VITALS:  Blood pressure 114/68, pulse 64, temperature 97.8 F (36.6 C), temperature source Oral, resp. rate 20, height 5\' 2"  (1.575 m), weight 76.2 kg, SpO2 96 %.  PHYSICAL EXAMINATION:   Physical Exam  GENERAL:  52 y.o.-year-old patient lying in the bed with no acute distress.  EYES: Pupils equal, round, reactive to light and accommodation. No scleral icterus. Extraocular muscles intact.  HEENT: Head atraumatic, normocephalic. Oropharynx and nasopharynx clear.  NECK:  Supple, no jugular venous distention. No thyroid enlargement, no tenderness.  LUNGS: Normal breath sounds bilaterally, no wheezing, rales, rhonchi. No use of accessory muscles of respiration.   CARDIOVASCULAR: S1, S2 normal. No murmurs, rubs, or gallops.  ABDOMEN: Soft, nontender, nondistended. Bowel sounds present. No organomegaly or mass.  EXTREMITIES: No cyanosis, clubbing or edema b/l.    Left wrist dressing NEUROLOGIC: Cranial nerves II through XII are intact. No focal Motor or sensory deficits b/l.   PSYCHIATRIC: The patient is alert and oriented x 3.  SKIN: No obvious rash, lesion, or ulcer.   LABORATORY PANEL:   CBC Recent Labs  Lab 04/17/18 0418  WBC 4.7  HGB 12.2  HCT 38.0  PLT 219   ------------------------------------------------------------------------------------------------------------------ Chemistries  Recent Labs  Lab 04/15/18 2313 04/17/18 0418  NA 140 139  K 3.4* 3.5  CL 108 106  CO2 23 25  GLUCOSE 108* 114*  BUN 13 11  CREATININE 1.09* 0.95  CALCIUM 8.8* 8.5*  AST 22  --   ALT 20  --   ALKPHOS 111  --   BILITOT 0.5  --    ------------------------------------------------------------------------------------------------------------------  Cardiac Enzymes No results for input(s): TROPONINI in the last 168 hours. ------------------------------------------------------------------------------------------------------------------  RADIOLOGY:  No results found.   ASSESSMENT AND PLAN:   This is a 52 year old female admitted for cellulitis.  *  Left wrist laceration with cellulitis Continue IV antibiotics.  Cultures pending. GNR and GPR in prelim rsults Pain medications added. Appreciate orthopedic input Stop vancomycin today Did not open the wound as it was dressed earlier by Dr. Ernest PineHooten.  Discussed with him.  *  Hypertension: Controlled; continue atenolol  * Hyperlipidemia: Continue statin therapy  * Hypokalemia Replaced  *  DVT prophylaxis On Lovenox  All the records are reviewed and case discussed with Care Management/Social Worker Management plans discussed with the patient, family and they are in  agreement.  CODE  STATUS: FULL CODE  DVT Prophylaxis: SCDs  TOTAL TIME TAKING CARE OF THIS PATIENT: 35 minutes.   POSSIBLE D/C IN 1-2 DAYS, DEPENDING ON CLINICAL CONDITION.  Molinda Bailiff Shannon Cortez M.D on 04/18/2018 at 1:07 PM  Between 7am to 6pm - Pager - [phone]  After 6pm go to [email or web address] - password [email or web address]  [facility] [place] [facility]  Office  [phone]  CC: Primary care physician; System, Pcp Not In  Note: This dictation was prepared with Dragon dictation along with smaller phrase technology. Any transcriptional errors that result from this process are unintentional.

## 2018-04-19 MED ORDER — METHOCARBAMOL 500 MG PO TABS: 500.0000 mg | ORAL_TABLET | Freq: Two times a day (BID) | ORAL | 0 refills | Status: DC

## 2018-04-19 MED ORDER — AMOXICILLIN-POT CLAVULANATE 875-125 MG PO TABS: 1.0000 | ORAL_TABLET | Freq: Two times a day (BID) | ORAL | 0 refills | Status: AC

## 2018-04-19 MED ORDER — OXYCODONE-ACETAMINOPHEN 5-325 MG PO TABS: 1.0000 | ORAL_TABLET | Freq: Four times a day (QID) | ORAL | 0 refills | Status: DC | PRN

## 2018-04-19 NOTE — Discharge Instructions (Signed)
Resume diet and activity as before ° ° °

## 2018-04-20 LAB — CULTURE, BLOOD (ROUTINE X 2)
Culture: NO GROWTH
Special Requests: ADEQUATE

## 2018-04-21 LAB — AEROBIC/ANAEROBIC CULTURE W GRAM STAIN (SURGICAL/DEEP WOUND): Special Requests: NORMAL

## 2018-04-21 LAB — CULTURE, BLOOD (ROUTINE X 2): Culture: NO GROWTH

## 2018-04-21 LAB — AEROBIC/ANAEROBIC CULTURE (SURGICAL/DEEP WOUND): CULTURE: NORMAL

## 2018-04-29 NOTE — Discharge Summary (Signed)
[facility] - [place] at [facility]   PATIENT NAME: Shannon Cortez    MR#:  [id number]  DATE OF BIRTH:  1966-04-20  DATE OF ADMISSION:  04/16/2018 ADMITTING PHYSICIAN: Arnaldo Natal, MD  DATE OF DISCHARGE: 04/19/2018  4:17 PM  PRIMARY CARE PHYSICIAN: System, Pcp Not In   ADMISSION DIAGNOSIS:  Wound infection [T14.8XXA, L08.9] Cellulitis of left arm [L03.114]  DISCHARGE DIAGNOSIS:  Active Problems:   Cellulitis   SECONDARY DIAGNOSIS:   Past Medical History:  Diagnosis Date  . DDD (degenerative disc disease), lumbosacral   . Hyperlipidemia   . Hypertension      ADMITTING HISTORY  Chief Complaint: Wound drainage HPI: Patient with past medical history of hypertension and hyperlipidemia presents to the emergency department for recheck of the wound that was sutured 4 days ago.  The patient was having a dressing change and when her husband tried to place her stabilizer brace on the affected wrist she reports a small explosion of pus.  The patient reports that she has been nauseous and has had some chills but denies fever or vomiting.  In the emergency department the patient's wrist was oozing a mixture of pus and blood and her hand was swollen to just below the knuckles.  The wound was cultured and sutures removed prior to initiation of antibiotics.  The hospitalist staff was called for further management   HOSPITAL COURSE:   This is a 52 year old female admitted for cellulitis.  * Left wrist laceration with cellulitis Patient was admitted to the medical floor and started on IV vancomycin and ceftriaxone.  Seen by surgery and orthopedic services.  Case was discussed with Dr. Ernest Pine on day of discharge.  Her wound looks much better.  Patient was given dressing instructions.  Pain well controlled. Cultures from her wound grew normal skin flora.  Change to Augmentin at discharge. Follow-up with primary care physician and Dr. Ernest Pine in 1 week.  Afebrile and  normal WBC.  * Hypertension: Controlled; continue atenolol  *Hyperlipidemia: Continue statin therapy  * Hypokalemia Replaced  Patient stable for discharge home.  * DVT prophylaxis On Lovenox  CONSULTS OBTAINED:  Treatment Team:  Carolan Shiver, MD Donato Heinz, MD  DRUG ALLERGIES:   Allergies  Allergen Reactions  . Toradol [Ketorolac Tromethamine] Itching  . Sulfa Antibiotics Rash    DISCHARGE MEDICATIONS:   Allergies as of 04/19/2018      Reactions   Toradol [ketorolac Tromethamine] Itching   Sulfa Antibiotics Rash      Medication List    STOP taking these medications   cephALEXin 500 MG capsule Commonly known as:  KEFLEX     TAKE these medications   atenolol 25 MG tablet Commonly known as:  TENORMIN Take 25 mg by mouth daily.   atorvastatin 20 MG tablet Commonly known as:  LIPITOR Take 20 mg by mouth daily.   ibuprofen 800 MG tablet Commonly known as:  ADVIL,MOTRIN Take 800 mg by mouth every 8 (eight) hours as needed for moderate pain.   methocarbamol 500 MG tablet Commonly known as:  ROBAXIN Take 1 tablet (500 mg total) by mouth 2 (two) times daily.   oxyCODONE-acetaminophen 5-325 MG tablet Commonly known as:  PERCOCET/ROXICET Take 1 tablet by mouth every 6 (six) hours as needed for moderate pain.     ASK your doctor about these medications   amoxicillin-clavulanate 875-125 MG tablet Commonly known as:  AUGMENTIN Take 1 tablet by mouth 2 (two) times daily for 7 days. Ask  about: Should I take this medication?       Today   VITAL SIGNS:  Blood pressure (!) 141/65, pulse 70, temperature 97.8 F (36.6 C), temperature source Oral, resp. rate 20, height 5\' 2"  (1.575 m), weight 73.6 kg, SpO2 97 %.  I/O:  No intake or output data in the 24 hours ending 04/29/18 1652  PHYSICAL EXAMINATION:  Physical Exam  GENERAL:  52 y.o.-year-old patient lying in the bed with no acute distress.  LUNGS: Normal breath sounds bilaterally,  no wheezing, rales,rhonchi or crepitation. No use of accessory muscles of respiration.  CARDIOVASCULAR: S1, S2 normal. No murmurs, rubs, or gallops.  ABDOMEN: Soft, non-tender, non-distended. Bowel sounds present. No organomegaly or mass.  NEUROLOGIC: Moves all 4 extremities. PSYCHIATRIC: The patient is alert and oriented x 3.  SKIN: Dressing over left wrist  DATA REVIEW:   CBC No results for input(s): WBC, HGB, HCT, PLT in the last 168 hours.  Chemistries  No results for input(s): NA, K, CL, CO2, GLUCOSE, BUN, CREATININE, CALCIUM, MG, AST, ALT, ALKPHOS, BILITOT in the last 168 hours.  Invalid input(s): GFRCGP  Cardiac Enzymes No results for input(s): TROPONINI in the last 168 hours.  Microbiology Results  Results for orders placed or performed during the hospital encounter of 04/16/18  Blood culture (routine x 2)     Status: None   Collection Time: 04/15/18 11:13 PM  Result Value Ref Range Status   Specimen Description BLOOD RIGHT HAND  Final   Special Requests   Final    BOTTLES DRAWN AEROBIC AND ANAEROBIC Blood Culture adequate volume   Culture   Final    NO GROWTH 5 DAYS Performed at [facility], [address] [place], [place] [address]    Report Status 04/20/2018 FINAL  Final  Aerobic/Anaerobic Culture (surgical/deep wound)     Status: None   Collection Time: 04/16/18  3:52 AM  Result Value Ref Range Status   Specimen Description   Final    WOUND Performed at [facility], [address] [place], [place] [address]    Special Requests   Final    Normal Performed at [facility], [address] [place], [place] [address]    Gram Stain   Final    FEW WBC PRESENT,BOTH PMN AND MONONUCLEAR RARE GRAM NEGATIVE RODS RARE GRAM POSITIVE RODS    Culture   Final    RARE NORMAL SKIN FLORA MODERATE FUSOBACTERIUM NECROPHORUM BETA LACTAMASE NEGATIVE Performed at [facility], [address] [place], [place] [address]    Report  Status 04/21/2018 FINAL  Final  Blood culture (routine x 2)     Status: None   Collection Time: 04/16/18  7:34 AM  Result Value Ref Range Status   Specimen Description BLOOD R HAND  Final   Special Requests   Final    BOTTLES DRAWN AEROBIC AND ANAEROBIC Blood Culture results may not be optimal due to an excessive volume of blood received in culture bottles   Culture   Final    NO GROWTH 5 DAYS Performed at [facility], [address] [place], [place] [address]    Report Status 04/21/2018 FINAL  Final    RADIOLOGY:  No results found.  Follow up with PCP in 1 week.  Management plans discussed with the patient, family and they are in agreement.  CODE STATUS:  Code Status History    Date Active Date Inactive Code Status Order ID Comments User Context  04/16/2018 0638 04/19/2018 2000 Full Code [id number]  Arnaldo Nataliamond, Michael S, MD Inpatient      TOTAL TIME TAKING CARE OF THIS PATIENT ON DAY OF DISCHARGE: more than 30 minutes.   Molinda BailiffSrikar R Ashlyne Olenick M.D on 04/29/2018 at 4:52 PM  Between 7am to 6pm - Pager - [phone]  After 6pm go to [email or web address] - password [email or web address]  [facility] [place] [facility]  Office  [phone]  CC: Primary care physician; System, Pcp Not In  Note: This dictation was prepared with Dragon dictation along with smaller phrase technology. Any transcriptional errors that result from this process are unintentional.

## 2018-05-12 DIAGNOSIS — G8929 Other chronic pain: Secondary | ICD-10-CM | POA: Insufficient documentation

## 2018-05-12 DIAGNOSIS — M5416 Radiculopathy, lumbar region: Secondary | ICD-10-CM | POA: Insufficient documentation

## 2018-05-12 DIAGNOSIS — I10 Essential (primary) hypertension: Secondary | ICD-10-CM | POA: Insufficient documentation

## 2018-05-12 DIAGNOSIS — E782 Mixed hyperlipidemia: Secondary | ICD-10-CM | POA: Insufficient documentation

## 2018-05-27 ENCOUNTER — Other Ambulatory Visit: Payer: Self-pay

## 2018-05-27 ENCOUNTER — Ambulatory Visit
Payer: BLUE CROSS/BLUE SHIELD | Attending: Student in an Organized Health Care Education/Training Program | Admitting: Student in an Organized Health Care Education/Training Program

## 2018-05-27 ENCOUNTER — Encounter: Payer: Self-pay | Admitting: Student in an Organized Health Care Education/Training Program

## 2018-05-27 VITALS — BP 156/95 | HR 82 | Temp 98.5°F | Resp 14 | Ht 62.0 in | Wt 170.0 lb

## 2018-05-27 DIAGNOSIS — Z9889 Other specified postprocedural states: Secondary | ICD-10-CM | POA: Diagnosis not present

## 2018-05-27 DIAGNOSIS — Z981 Arthrodesis status: Secondary | ICD-10-CM | POA: Diagnosis not present

## 2018-05-27 DIAGNOSIS — G894 Chronic pain syndrome: Secondary | ICD-10-CM | POA: Diagnosis present

## 2018-05-27 DIAGNOSIS — M542 Cervicalgia: Secondary | ICD-10-CM | POA: Diagnosis not present

## 2018-05-27 NOTE — Progress Notes (Signed)
Patient's Name: Shannon Cortez  MRN: [id number]  Referring Provider: Dion Body, MD  DOB: 03/04/1966  PCP: System, Pcp Not In  DOS: 05/27/2018  Note by: Gillis Santa, MD  Service setting: Ambulatory outpatient  Specialty: Interventional Pain Management  Location: [facility] (AMB) Pain Management Facility  Visit type: Initial Patient Evaluation  Patient type: New Patient   Primary Reason(s) for Visit: Encounter for initial evaluation of one or more chronic problems (new to examiner) potentially causing chronic pain, and posing a threat to normal musculoskeletal function. (Level of risk: High) CC: Back Pain (upper and lower) and Neck Pain  HPI  Shannon Cortez is a 53 y.o. year old, female patient, who comes today to see [place] for the first time for an initial evaluation of her chronic pain. She has Cellulitis; Chronic pain syndrome; H/O cervical spine surgery; and Neck pain on their problem list. Today she comes in for evaluation of her Back Pain (upper and lower) and Neck Pain  Pain Assessment: Location: Lower, Upper Back Radiating: left arm and left leg Onset: More than a month ago Duration: Chronic pain Quality: Aching, Throbbing, Burning Severity: 5 /10 (subjective, self-reported pain score)  Note: Reported level is inconsistent with clinical observations. Clinically the patient looks like a 2/10 A 2/10 is viewed as "Mild to Moderate" and described as noticeable and distracting. Impossible to hide from other people. More frequent flare-ups. Still possible to adapt and function close to normal. It can be very annoying and may have occasional stronger flare-ups. With discipline, patients may get used to it and adapt. Information on the proper use of the pain scale provided to the patient today. When using our objective Pain Scale, levels between 6 and 10/10 are said to belong in an emergency room, as it progressively worsens from a 6/10, described as severely limiting, requiring emergency care not  usually available at an outpatient pain management facility. At a 6/10 level, communication becomes difficult and requires great effort. Assistance to reach the emergency department may be required. Facial flushing and profuse sweating along with potentially dangerous increases in heart rate and blood pressure will be evident. Effect on ADL: moves slowly, stands often, slow getting up in morning, frequent falls Timing: Constant Modifying factors: exercises, elevation of legs, heat, medications, stretching BP: (!) 156/95  HR: 82  Onset and Duration: Gradual and Present longer than 3 months Cause of pain: Unknown Severity: NAS-11 at its worse: 10/10, NAS-11 at its best: 4/10, NAS-11 now: 5/10 and NAS-11 on the average: 4/10 Timing: Not influenced by the time of the day Aggravating Factors: Lifiting, Prolonged sitting, Prolonged standing, Squatting and Stooping  Alleviating Factors: Stretching, Cold packs, Hot packs, Medications, Relaxation therapy and Warm showers or baths Associated Problems: Fatigue, Numbness, Spasms, Tingling, Weakness, Pain that wakes patient up and Pain that does not allow patient to sleep Quality of Pain: Aching, Burning, Constant, Sharp, Shooting, Throbbing, Tingling and Uncomfortable Previous Examinations or Tests: CT scan, MRI scan, Spinal tap, X-rays, Nerve conduction test, Neurological evaluation, Neurosurgical evaluation, Chiropractic evaluation and Psychiatric evaluation Previous Treatments: Narcotic medications, Physical Therapy, Pool exercises, Relaxation therapy, Steroid treatments by mouth and Stretching exercises  The patient comes into the clinics today for the first time for a chronic pain management evaluation.   Patient is a very pleasant 53 year old female who moved from [place] approximately 3 months ago and is hoping to establish with pain medicine.  Of note patient does have a history of C4-C7 fusion performed in 2016 due to cervical radiculopathy  and  myelopathy.  In [place] her medication regimen consisted of Robaxin 500 mg twice daily as needed, ibuprofen 800 mg 3 times daily as needed along with Percocet 5 mg daily as needed.  Patient also utilizes CBD to help with her pain.  She works at an [employer].  Her husband's job made them relocate to [facility] however he can no longer work given problems from his diabetes.  Today I took the time to provide the patient with information regarding my pain practice. The patient was informed that my practice is divided into two sections: an interventional pain management section, as well as a completely separate and distinct medication management section. I explained that I have procedure days for my interventional therapies, and evaluation days for follow-ups and medication management. Because of the amount of documentation required during both, they are kept separated. This means that there is the possibility that she may be scheduled for a procedure on one day, and medication management the next. I have also informed her that because of staffing and facility limitations, I no longer take patients for medication management only. To illustrate the reasons for this, I gave the patient the example of surgeons, and how inappropriate it would be to refer a patient to his/her care, just to write for the post-surgical antibiotics on a surgery done by a different surgeon.   Because interventional pain management is my board-certified specialty, the patient was informed that joining my practice means that they are open to any and all interventional therapies. I made it clear that this does not mean that they will be forced to have any procedures done. What this means is that I believe interventional therapies to be essential part of the diagnosis and proper management of chronic pain conditions. Therefore, patients not interested in these interventional alternatives will be better served under the care of  a different practitioner.  The patient was also made aware of my Comprehensive Pain Management Safety Guidelines where by joining my practice, they limit all of their nerve blocks and joint injections to those done by our practice, for as long as we are retained to manage their care.   Historic Controlled Substance Pharmacotherapy Review  PMP and historical list of controlled substances: Oxycodone 5 mg daily as needed, last fill was in [place].    Medications: Bottles not available for inspection. Pharmacodynamics: Desired effects: Analgesia: The patient reports >50% benefit. Reported improvement in function: The patient reports medication allows her to accomplish basic ADLs. Clinically meaningful improvement in function (CMIF): Sustained CMIF goals met Perceived effectiveness: Described as relatively effective, allowing for increase in activities of daily living (ADL) Undesirable effects: Side-effects or Adverse reactions: None reported Historical Monitoring: The patient  reports no history of drug use. List of all UDS Test(s): No results found for: MDMA, COCAINSCRNUR, [place], [place], CANNABQUANT, [place], [place] List of other Serum/Urine Drug Screening Test(s):  No results found for: AMPHSCRSER, BARBSCRSER, BENZOSCRSER, COCAINSCRSER, COCAINSCRNUR, PCPSCRSER, PCPQUANT, THCSCRSER, THCU, CANNABQUANT, OPIATESCRSER, OXYSCRSER, PROPOXSCRSER, ETH Historical Background Evaluation: [place] PMP: Six (6) year initial data search conducted.             Page Department of public safety, offender search: Editor, commissioning Information) Non-contributory Risk Assessment Profile: Aberrant behavior: None observed or detected today Risk factors for fatal opioid overdose: None identified today Fatal overdose hazard ratio (HR): Calculation deferred Non-fatal overdose hazard ratio (HR): Calculation deferred Risk of opioid abuse or dependence: 0.7-3.0% with doses ? 36 MME/day and 6.1-26% with doses ?  120  MME/day. Substance use disorder (SUD) risk level: See below Personal History of Substance Abuse (SUD-Substance use disorder):  Alcohol: Negative  Illegal Drugs: Negative  Rx Drugs: Negative  ORT Risk Level calculation: Low Risk Opioid Risk Tool - 05/27/18 1336      Family History of Substance Abuse   Alcohol  Negative    Illegal Drugs  Negative    Rx Drugs  Negative      Personal History of Substance Abuse   Alcohol  Negative    Illegal Drugs  Negative    Rx Drugs  Negative      Age   Age between 45-45 years   No      History of Preadolescent Sexual Abuse   History of Preadolescent Sexual Abuse  Negative or Female      Psychological Disease   Psychological Disease  Negative    Depression  Negative      Total Score   Opioid Risk Tool Scoring  0    Opioid Risk Interpretation  Low Risk      ORT Scoring interpretation table:  Score <3 = Low Risk for SUD  Score between 4-7 = Moderate Risk for SUD  Score >8 = High Risk for Opioid Abuse   PHQ-2 Depression Scale:  Total score: 0  PHQ-2 Scoring interpretation table: (Score and probability of major depressive disorder)  Score 0 = No depression  Score 1 = 15.4% Probability  Score 2 = 21.1% Probability  Score 3 = 38.4% Probability  Score 4 = 45.5% Probability  Score 5 = 56.4% Probability  Score 6 = 78.6% Probability   PHQ-9 Depression Scale:  Total score: 0  PHQ-9 Scoring interpretation table:  Score 0-4 = No depression  Score 5-9 = Mild depression  Score 10-14 = Moderate depression  Score 15-19 = Moderately severe depression  Score 20-27 = Severe depression (2.4 times higher risk of SUD and 2.89 times higher risk of overuse)   Pharmacologic Plan: As per protocol, I have not taken over any controlled substance management, pending the results of ordered tests and/or consults.            Initial impression: Pending review of available data and ordered tests.  Meds   Current Outpatient Medications:  .  aspirin EC 81 MG  tablet, Take 81 mg by mouth daily., Disp: , Rfl:  .  atenolol (TENORMIN) 25 MG tablet, Take 25 mg by mouth daily., Disp: , Rfl:  .  atorvastatin (LIPITOR) 20 MG tablet, Take 20 mg by mouth daily., Disp: , Rfl:  .  ibuprofen (ADVIL,MOTRIN) 800 MG tablet, Take 800 mg by mouth every 8 (eight) hours as needed for moderate pain., Disp: , Rfl:  .  methocarbamol (ROBAXIN) 500 MG tablet, Take 1 tablet (500 mg total) by mouth 2 (two) times daily., Disp: 10 tablet, Rfl: 0 .  NON FORMULARY, Apply 8 mg topically 2 (two) times daily., Disp: , Rfl:  .  oxyCODONE-acetaminophen (PERCOCET/ROXICET) 5-325 MG tablet, Take 1 tablet by mouth every 6 (six) hours as needed for moderate pain., Disp: 20 tablet, Rfl: 0  Imaging Review  Wrist-L DG Complete:  Results for orders placed during the hospital encounter of 04/12/18  DG Wrist Complete Left   Narrative CLINICAL DATA:  Left posterior wrist pain laceration following a fall today.  EXAM: LEFT WRIST - COMPLETE 3+ VIEW  COMPARISON:  None.  FINDINGS: Shallow linear soft tissue defect in the dorsal aspect of the wrist. Two small infused ossicles  distal to the ulnar styloid. No acute fracture or dislocation.  IMPRESSION: No acute fracture or dislocation.   Electronically Signed   By: Claudie Revering M.D.   On: 04/12/2018 21:36      Complexity Note: Imaging results reviewed. Results shared with Shannon Cortez, using Layman's terms.                         ROS  Cardiovascular: Daily Aspirin intake and High blood pressure Pulmonary or Respiratory: No reported pulmonary signs or symptoms such as wheezing and difficulty taking a deep full breath (Asthma), difficulty blowing air out (Emphysema), coughing up mucus (Bronchitis), persistent dry cough, or temporary stoppage of breathing during sleep Neurological: No reported neurological signs or symptoms such as seizures, abnormal skin sensations, urinary and/or fecal incontinence, being born with an abnormal open  spine and/or a tethered spinal cord Review of Past Neurological Studies: No results found for this or any previous visit. Psychological-Psychiatric: Prone to panicking Gastrointestinal: Vomiting blood (Ulcers) and Irregular, infrequent bowel movements (Constipation) Genitourinary: Kidney disease Hematological: No reported hematological signs or symptoms such as prolonged bleeding, low or poor functioning platelets, bruising or bleeding easily, hereditary bleeding problems, low energy levels due to low hemoglobin or being anemic Endocrine: No reported endocrine signs or symptoms such as high or low blood sugar, rapid heart rate due to high thyroid levels, obesity or weight gain due to slow thyroid or thyroid disease Rheumatologic: Joint aches and or swelling due to excess weight (Osteoarthritis) Musculoskeletal: Negative for myasthenia gravis, muscular dystrophy, multiple sclerosis or malignant hyperthermia Work History: Working full time  Allergies  Shannon Cortez is allergic to toradol [ketorolac tromethamine] and sulfa antibiotics.  Laboratory Chemistry  Inflammation Markers (CRP: Acute Phase) (ESR: Chronic Phase) Lab Results  Component Value Date   LATICACIDVEN 1.13 04/16/2018                         Rheumatology Markers No results found for: RF, ANA, LABURIC, URICUR, LYMEIGGIGMAB, LYMEABIGMQN, HLAB27                      Renal Function Markers Lab Results  Component Value Date   BUN 13 04/18/2018   CREATININE 0.88 04/18/2018   GFRAA >60 04/18/2018   GFRNONAA >60 04/18/2018                             Hepatic Function Markers Lab Results  Component Value Date   AST 22 04/18/2018   ALT 21 04/18/2018   ALBUMIN 3.6 04/18/2018   ALKPHOS 94 04/18/2018   LIPASE 33 04/18/2018                        Electrolytes Lab Results  Component Value Date   NA 139 04/18/2018   K 3.6 04/18/2018   CL 106 04/18/2018   CALCIUM 8.7 (L) 04/18/2018                        Neuropathy  Markers Lab Results  Component Value Date   HGBA1C 4.6 (L) 04/15/2018                        CNS Tests No results found for: COLORCSF, APPEARCSF, RBCCOUNTCSF, WBCCSF, POLYSCSF, LYMPHSCSF, EOSCSF, PROTEINCSF, GLUCCSF, JCVIRUS, CSFOLI, IGGCSF  Bone Pathology Markers No results found for: VD25OH, [id number], [id number], EX9371IR6, 25OHVITD1, 25OHVITD2, 25OHVITD3, TESTOFREE, TESTOSTERONE                       Coagulation Parameters Lab Results  Component Value Date   PLT 219 04/17/2018                        Cardiovascular Markers Lab Results  Component Value Date   HGB 12.2 04/17/2018   HCT 38.0 04/17/2018                         CA Markers No results found for: CEA, CA125, LABCA2                      Note: Lab results reviewed.  PFSH  Drug: Shannon Cortez  reports no history of drug use. Alcohol:  reports no history of alcohol use. Tobacco:  reports that she has never smoked. She has never used smokeless tobacco. Medical:  has a past medical history of DDD (degenerative disc disease), lumbosacral, Hyperlipidemia, and Hypertension. Family: family history includes Hepatitis in her mother.  Past Surgical History:  Procedure Laterality Date  . ABDOMINAL HYSTERECTOMY    . NASAL RECONSTRUCTION WITH SEPTAL REPAIR    . NECK SURGERY     Active Ambulatory Problems    Diagnosis Date Noted  . Cellulitis 04/16/2018  . Chronic pain syndrome 05/27/2018  . H/O cervical spine surgery 05/27/2018  . Neck pain 05/27/2018   Resolved Ambulatory Problems    Diagnosis Date Noted  . No Resolved Ambulatory Problems   Past Medical History:  Diagnosis Date  . DDD (degenerative disc disease), lumbosacral   . Hyperlipidemia   . Hypertension    Constitutional Exam  General appearance: Well nourished, well developed, and well hydrated. In no apparent acute distress Vitals:   05/27/18 1327  BP: (!) 156/95  Pulse: 82  Resp: 14  Temp: 98.5 F (36.9 C)  TempSrc: Oral   SpO2: 98%  Weight: 170 lb (77.1 kg)  Height: _0  (1.575 m)   BMI Assessment: Estimated body mass index is 31.09 kg/m as calculated from the following:   Height as of this encounter: _1  (1.575 m).   Weight as of this encounter: 170 lb (77.1 kg).  BMI interpretation table: BMI level Category Range association with higher incidence of chronic pain  <18 kg/m2 Underweight   18.5-24.9 kg/m2 Ideal body weight   25-29.9 kg/m2 Overweight Increased incidence by 20%  30-34.9 kg/m2 Obese (Class I) Increased incidence by 68%  35-39.9 kg/m2 Severe obesity (Class II) Increased incidence by 136%  >40 kg/m2 Extreme obesity (Class III) Increased incidence by 254%   Patient's current BMI Ideal Body weight  Body mass index is 31.09 kg/m. Ideal body weight: 50.1 kg (110 lb 7.2 oz) Adjusted ideal body weight: 60.9 kg (134 lb 4.3 oz)   BMI Readings from Last 4 Encounters:  05/27/18 31.09 kg/m  04/19/18 29.69 kg/m  04/12/18 31.46 kg/m   Wt Readings from Last 4 Encounters:  05/27/18 170 lb (77.1 kg)  04/19/18 162 lb 4.8 oz (73.6 kg)  04/12/18 172 lb (78 kg)  Psych/Mental status: Alert, oriented x 3 (person, place, & time)       Eyes: PERLA Respiratory: No evidence of acute respiratory distress  Cervical Spine Area Exam  Skin & Axial Inspection: No masses, redness, edema, swelling, or  associated skin lesions Alignment: Symmetrical Functional ROM: Decreased ROM, bilaterally Stability: No instability detected Muscle Tone/Strength: Functionally intact. No obvious neuro-muscular anomalies detected. Sensory (Neurological): Musculoskeletal pain pattern Palpation: Complains of area being tender to palpation Positive provocative maneuver for for cervical facet disease  Upper Extremity (UE) Exam    Side: Right upper extremity  Side: Left upper extremity  Skin & Extremity Inspection: Skin color, temperature, and hair growth are WNL. No peripheral edema or cyanosis. No masses, redness, swelling,  asymmetry, or associated skin lesions. No contractures.  Skin & Extremity Inspection: Skin color, temperature, and hair growth are WNL. No peripheral edema or cyanosis. No masses, redness, swelling, asymmetry, or associated skin lesions. No contractures.  Functional ROM: Unrestricted ROM          Functional ROM: Unrestricted ROM          Muscle Tone/Strength: Functionally intact. No obvious neuro-muscular anomalies detected.  Muscle Tone/Strength: Functionally intact. No obvious neuro-muscular anomalies detected.  Sensory (Neurological): Unimpaired          Sensory (Neurological): Unimpaired          Palpation: No palpable anomalies              Palpation: No palpable anomalies              Provocative Test(s):  Phalen's test: deferred Tinel's test: deferred Apley's scratch test (touch opposite shoulder):  Action 1 (Across chest): deferred Action 2 (Overhead): deferred Action 3 (LB reach): deferred   Provocative Test(s):  Phalen's test: deferred Tinel's test: deferred Apley's scratch test (touch opposite shoulder):  Action 1 (Across chest): deferred Action 2 (Overhead): deferred Action 3 (LB reach): deferred    Thoracic Spine Area Exam  Skin & Axial Inspection: No masses, redness, or swelling Alignment: Symmetrical Functional ROM: Unrestricted ROM Stability: No instability detected Muscle Tone/Strength: Functionally intact. No obvious neuro-muscular anomalies detected. Sensory (Neurological): Unimpaired Muscle strength & Tone: No palpable anomalies  Lumbar Spine Area Exam  Skin & Axial Inspection: No masses, redness, or swelling Alignment: Symmetrical Functional ROM: Unrestricted ROM       Stability: No instability detected Muscle Tone/Strength: Functionally intact. No obvious neuro-muscular anomalies detected. Sensory (Neurological): Unimpaired Palpation: No palpable anomalies       Provocative Tests: Hyperextension/rotation test: deferred today       Lumbar quadrant test  (Kemp's test): deferred today       Lateral bending test: deferred today       Patrick's Maneuver: deferred today                   FABER* test: deferred today                   S-I anterior distraction/compression test: deferred today         S-I lateral compression test: deferred today         S-I Thigh-thrust test: deferred today         S-I Gaenslen's test: deferred today         *(Flexion, ABduction and External Rotation)  Gait & Posture Assessment  Ambulation: Unassisted Gait: Relatively normal for age and body habitus Posture: WNL   Lower Extremity Exam    Side: Right lower extremity  Side: Left lower extremity  Stability: No instability observed          Stability: No instability observed          Skin & Extremity Inspection: Skin color, temperature, and hair growth are WNL.  No peripheral edema or cyanosis. No masses, redness, swelling, asymmetry, or associated skin lesions. No contractures.  Skin & Extremity Inspection: Skin color, temperature, and hair growth are WNL. No peripheral edema or cyanosis. No masses, redness, swelling, asymmetry, or associated skin lesions. No contractures.  Functional ROM: Unrestricted ROM                  Functional ROM: Unrestricted ROM                  Muscle Tone/Strength: Functionally intact. No obvious neuro-muscular anomalies detected.  Muscle Tone/Strength: Functionally intact. No obvious neuro-muscular anomalies detected.  Sensory (Neurological): Unimpaired        Sensory (Neurological): Unimpaired        DTR: Patellar: deferred today Achilles: deferred today Plantar: deferred today  DTR: Patellar: deferred today Achilles: deferred today Plantar: deferred today  Palpation: No palpable anomalies  Palpation: No palpable anomalies   Assessment  Primary Diagnosis & Pertinent Problem List: The primary encounter diagnosis was Chronic pain syndrome. Diagnoses of Neck pain, Hx of fusion of cervical spine, and H/O cervical spine surgery ([facility]  C5-C7 in [place]) were also pertinent to this visit.  Visit Diagnosis (New problems to examiner): 1. Chronic pain syndrome   2. Neck pain   3. Hx of fusion of cervical spine   4. H/O cervical spine surgery ([facility] C5-C7 in [place])    Patient is a very pleasant 53 year old female who moved from [place] approximately 3 months ago and is hoping to establish with pain medicine.  Of note patient does have a history of C4-C7 fusion performed in 2016 due to cervical radiculopathy and myelopathy.  In [place] her medication regimen consisted of Robaxin 500 mg twice daily as needed, ibuprofen 800 mg 3 times daily as needed along with Percocet 5 mg daily as needed.  Patient also utilizes CBD to help with her pain.  She works at an [employer].  Her husband's job made them relocate to [facility] however he can no longer work given problems from his diabetes.  UDS today.  So long as UDS appropriate, can consider Percocet 5 mg daily PRN.  Previous analgesic regimen including Robaxin 500 mg twice daily as needed, ibuprofen 800 mg 3 times daily, CBD.  Interventional considerations could include cervical facet medial branch nerve blocks as well as cervical and trapezius TPI  Plan of Care (Initial workup plan)  Note: Please be advised that as per protocol, today's visit has been an evaluation only. We have not taken over the patient's controlled substance management.  Ordered Lab-work, Procedure(s), Referral(s), & Consult(s): Orders Placed This Encounter  Procedures  . Compliance Drug Analysis, Ur   Pharmacological management options:  Opioid Analgesics: The patient was informed that there is no guarantee that she would be a candidate for opioid analgesics. The decision will be made following CDC guidelines. This decision will be based on the results of diagnostic studies, as well as Shannon Cortez risk profile.   Membrane stabilizer: To be determined at a later time   Muscle relaxant: To be determined at a later time  NSAID: To be determined at a later time  Other analgesic(s): To be determined at a later time   Interventional management options: Shannon Cortez was informed that there is no guarantee that she would be a candidate for interventional therapies. The decision will be based on the results of diagnostic studies, as well as Shannon Cortez risk profile.  Procedure(s) under  consideration:  Cervical facet medial branch nerve block Cervical TPI    Future Appointments  Date Time Provider [place]  06/17/2018 11:30 AM Gillis Santa, MD [facility] None    Primary Care Physician: System, Pcp Not In Location: [facility] Out[facility] Pain Management Facility Note by: Gillis Santa, M.D, Date: 05/27/2018; Time: 3:44 PM  There are no Patient Instructions on file for this visit.

## 2018-05-27 NOTE — Progress Notes (Signed)
Safety precautions to be maintained throughout the outpatient stay will include: orient to surroundings, keep bed in low position, maintain call bell within reach at all times, provide assistance with transfer out of bed and ambulation.  

## 2018-06-01 LAB — COMPLIANCE DRUG ANALYSIS, UR

## 2018-06-17 ENCOUNTER — Ambulatory Visit
Payer: BLUE CROSS/BLUE SHIELD | Attending: Student in an Organized Health Care Education/Training Program | Admitting: Student in an Organized Health Care Education/Training Program

## 2018-06-17 ENCOUNTER — Other Ambulatory Visit: Payer: Self-pay

## 2018-06-17 ENCOUNTER — Encounter: Payer: Self-pay | Admitting: Student in an Organized Health Care Education/Training Program

## 2018-06-17 VITALS — BP 138/78 | HR 79 | Temp 98.3°F | Resp 16 | Ht 62.5 in | Wt 170.0 lb

## 2018-06-17 DIAGNOSIS — M542 Cervicalgia: Secondary | ICD-10-CM | POA: Diagnosis present

## 2018-06-17 DIAGNOSIS — Z981 Arthrodesis status: Secondary | ICD-10-CM | POA: Diagnosis present

## 2018-06-17 DIAGNOSIS — M7918 Myalgia, other site: Secondary | ICD-10-CM | POA: Insufficient documentation

## 2018-06-17 DIAGNOSIS — Z9889 Other specified postprocedural states: Secondary | ICD-10-CM | POA: Diagnosis not present

## 2018-06-17 DIAGNOSIS — G894 Chronic pain syndrome: Secondary | ICD-10-CM | POA: Insufficient documentation

## 2018-06-17 MED ORDER — OXYCODONE-ACETAMINOPHEN 5-325 MG PO TABS: 1.0000 | ORAL_TABLET | Freq: Every day | ORAL | 0 refills | Status: DC | PRN

## 2018-06-17 MED ORDER — METHOCARBAMOL 500 MG PO TABS: 500.0000 mg | ORAL_TABLET | Freq: Two times a day (BID) | ORAL | 5 refills | Status: DC | PRN

## 2018-06-17 NOTE — Patient Instructions (Addendum)
1. Sign opioid contract   2. Follow up for left TPI  3. MM in 2.5 months  Prescriptions for Percocet and Methocarbamol were sent to your pharmacy.

## 2018-06-17 NOTE — Progress Notes (Signed)
Safety precautions to be maintained throughout the outpatient stay will include: orient to surroundings, keep bed in low position, maintain call bell within reach at all times, provide assistance with transfer out of bed and ambulation.  

## 2018-06-17 NOTE — Progress Notes (Signed)
Patient's Name: Shannon Cortez  MRN: [id number]  Referring Provider: No ref. provider found  DOB: 1965/10/31  PCP: System, Pcp Not In  DOS: 06/17/2018  Note by: Gillis Santa, MD  Service setting: Ambulatory outpatient  Specialty: Interventional Pain Management  Location: [facility] (AMB) Pain Management Facility    Patient type: Established   Primary Reason(s) for Visit: Encounter for evaluation before starting new chronic pain management plan of care (Level of risk: moderate) CC: Neck Pain (to left shoulder) and Back Pain (lower)  HPI  Shannon Cortez is a 53 y.o. year old, female patient, who comes today for a follow-up evaluation to review the test results and decide on a treatment plan. She has Cellulitis; Chronic pain syndrome; Hx of fusion of cervical spine; Neck pain; and Myofascial pain syndrome on their problem list. Her primarily concern today is the Neck Pain (to left shoulder) and Back Pain (lower)  Pain Assessment: Location:   Neck(Also, lower back which radiates to hips bilaterally; lower back is a throbbing pain) Radiating: to left shoulder Onset: More than a month ago Duration: Chronic pain Quality: Stabbing, Burning Severity: 6 /10 (subjective, self-reported pain score)  Note: Reported level is inconsistent with clinical observations. Clinically the patient looks like a 2/10 A 2/10 is viewed as "Mild to Moderate" and described as noticeable and distracting. Impossible to hide from other people. More frequent flare-ups. Still possible to adapt and function close to normal. It can be very annoying and may have occasional stronger flare-ups. With discipline, patients may get used to it and adapt. Information on the proper use of the pain scale provided to the patient today. When using our objective Pain Scale, levels between 6 and 10/10 are said to belong in an emergency room, as it progressively worsens from a 6/10, described as severely limiting, requiring emergency care not usually  available at an outpatient pain management facility. At a 6/10 level, communication becomes difficult and requires great effort. Assistance to reach the emergency department may be required. Facial flushing and profuse sweating along with potentially dangerous increases in heart rate and blood pressure will be evident. Effect on ADL: must sleep with head and neck elevated Timing: Constant Modifying factors: heat, meds, stretching BP: 138/78  HR: 79  Shannon Cortez comes in today for a follow-up visit after her initial evaluation on 05/27/2018. Today we went over the results of her tests. These were explained in "Layman's terms". During today's appointment we went over my diagnostic impression, as well as the proposed treatment plan.  In considering the treatment plan options, Ms. Bremner was reminded that I no longer take patients for medication management only. I asked her to let me know if she had no intention of taking advantage of the interventional therapies, so that we could make arrangements to provide this space to someone interested. I also made it clear that undergoing interventional therapies for the purpose of getting pain medications is very inappropriate on the part of a patient, and it will not be tolerated in this practice. This type of behavior would suggest true addiction and therefore it requires referral to an addiction specialist.   Further details on both, my assessment(s), as well as the proposed treatment plan, please see below.  Controlled Substance Pharmacotherapy Assessment REMS (Risk Evaluation and Mitigation Strategy)  Analgesic: Percocet 5 mg daily PRN does not take every day Pill Count: None expected due to no prior prescriptions written by our practice. Rise Patience, RN  06/17/2018 11:22 AM  Signed Safety  precautions to be maintained throughout the outpatient stay will include: orient to surroundings, keep bed in low position, maintain call bell within reach at all  times, provide assistance with transfer out of bed and ambulation.    Pharmacokinetics: Liberation and absorption (onset of action): WNL Distribution (time to peak effect): WNL Metabolism and excretion (duration of action): WNL         Pharmacodynamics: Desired effects: Analgesia: Ms. Quickel reports >50% benefit. Functional ability: Patient reports that medication allows her to accomplish basic ADLs Clinically meaningful improvement in function (CMIF): Sustained CMIF goals met Perceived effectiveness: Described as relatively effective, allowing for increase in activities of daily living (ADL) Undesirable effects: Side-effects or Adverse reactions: None reported Monitoring: [place] PMP: Online review of the past 24-monthperiod previously conducted. Not applicable at this point since we have not taken over the patient's medication management yet. List of other Serum/Urine Drug Screening Test(s):  No results found for: AMPHSCRSER, BARBSCRSER, BENZOSCRSER, COCAINSCRSER, COCAINSCRNUR, PCPSCRSER, THCSCRSER, THCU, CANNABQUANT, OEads [place] of all UDS test(s) done:  Lab Results  Component Value Date   SUMMARY FINAL 05/27/2018   Last UDS on record: Summary  Date Value Ref Range Status  05/27/2018 FINAL  Final    Comment:    ==================================================================== TOXASSURE COMP DRUG ANALYSIS,UR ==================================================================== Test                             Result       Flag       Units Drug Present and Declared for Prescription Verification   Methocarbamol                  PRESENT      EXPECTED   Acetaminophen                  PRESENT      EXPECTED   Atenolol                       PRESENT      EXPECTED Drug Present not Declared for Prescription Verification   Carboxy-THC                    20           UNEXPECTED ng/mg creat    Carboxy-THC is a metabolite of tetrahydrocannabinol   (THC).    [facility] of [facility] most commonly illicit, but THC is also present    in a scheduled prescription medication.   Diphenhydramine                PRESENT      UNEXPECTED   Doxylamine                     PRESENT      UNEXPECTED   Dextromethorphan               PRESENT      UNEXPECTED   Dextrorphan/Levorphanol        PRESENT      UNEXPECTED    Dextrorphan is an expected metabolite of dextromethorphan, an    over-the-counter or prescription cough suppressant. Dextrorphan    cannot be distinguished from the scheduled prescription    medication levorphanol by the method used for analysis. Drug Absent but Declared for Prescription Verification   Oxycodone  Not Detected UNEXPECTED ng/mg creat   Salicylate                     Not Detected UNEXPECTED    Aspirin, as indicated in the declared medication list, is not    always detected even when used as directed.   Ibuprofen                      Not Detected UNEXPECTED    Ibuprofen, as indicated in the declared medication list, is not    always detected even when used as directed. ==================================================================== Test                      Result    Flag   Units      Ref Range   Creatinine              59               mg/dL      >=20 ==================================================================== Declared Medications:  The flagging and interpretation on this report are based on the  following declared medications.  Unexpected results may arise from  inaccuracies in the declared medications.  **Note: The testing scope of this panel includes these medications:  Atenolol  Methocarbamol (Robaxin)  Oxycodone (Percocet)  **Note: The testing scope of this panel does not include small to  moderate amounts of these reported medications:  Acetaminophen (Percocet)  Aspirin (Aspirin 81)  Ibuprofen  **Note: The testing scope of this panel does not include following  reported medications:   Atorvastatin (Lipitor) ==================================================================== For clinical consultation, please call [phone]. ====================================================================    UDS interpretation: No unexpected findings.          Medication Assessment Form: Patient introduced to form today Treatment compliance: Treatment may start today if patient agrees with proposed plan. Evaluation of compliance is not applicable at this point Risk Assessment Profile: Aberrant behavior: See initial evaluations. None observed or detected today Comorbid factors increasing risk of overdose: See initial evaluation. No additional risks detected today Opioid risk tool (ORT):  Opioid Risk  06/17/2018  Alcohol 0  Illegal Drugs 0  Rx Drugs 0  Alcohol 0  Illegal Drugs 0  Rx Drugs 0  Age between 16-45 years  0  History of Preadolescent Sexual Abuse 0  Psychological Disease 0  Depression 0  Opioid Risk Tool Scoring 0  Opioid Risk Interpretation Low Risk    ORT Scoring interpretation table:  Score <3 = Low Risk for SUD  Score between 4-7 = Moderate Risk for SUD  Score >8 = High Risk for Opioid Abuse   Risk of substance use disorder (SUD): Low  Risk Mitigation Strategies:  Patient opioid safety counseling: Completed today. Counseling provided to patient as per "Patient Counseling Document". Document signed by patient, attesting to counseling and understanding Patient-Prescriber Agreement (PPA): Obtained today.  Controlled substance notification to other providers: Written and sent today.  Pharmacologic Plan: Today we may be taking over the patient's pharmacological regimen. See below.             Laboratory Chemistry  Inflammation Markers (CRP: Acute Phase) (ESR: Chronic Phase) Lab Results  Component Value Date   LATICACIDVEN 1.13 04/16/2018                         Rheumatology Markers No results found for: RF, ANA, [place], [place], [place],  [place], HLAB27  Renal Function Markers Lab Results  Component Value Date   BUN 13 04/18/2018   CREATININE 0.88 04/18/2018   GFRAA >60 04/18/2018   GFRNONAA >60 04/18/2018                             Hepatic Function Markers Lab Results  Component Value Date   AST 22 04/18/2018   ALT 21 04/18/2018   ALBUMIN 3.6 04/18/2018   ALKPHOS 94 04/18/2018   LIPASE 33 04/18/2018                        Electrolytes Lab Results  Component Value Date   NA 139 04/18/2018   K 3.6 04/18/2018   CL 106 04/18/2018   CALCIUM 8.7 (L) 04/18/2018                        Neuropathy Markers Lab Results  Component Value Date   HGBA1C 4.6 (L) 04/15/2018                        CNS Tests No results found for: COLORCSF, APPEARCSF, RBCCOUNTCSF, WBCCSF, POLYSCSF, LYMPHSCSF, EOSCSF, PROTEINCSF, GLUCCSF, JCVIRUS, CSFOLI, IGGCSF                      Bone Pathology Markers No results found for: Short, VD125OH2TOT, [id number], FA2130QM5, 25OHVITD1, 25OHVITD2, 25OHVITD3, TESTOFREE, TESTOSTERONE                       Coagulation Parameters Lab Results  Component Value Date   PLT 219 04/17/2018                        Cardiovascular Markers Lab Results  Component Value Date   HGB 12.2 04/17/2018   HCT 38.0 04/17/2018                         CA Markers No results found for: CEA, CA125, LABCA2                      Endocrine Markers No results found for: TSH, FREET4, TESTOFREE, TESTOSTERONE, ESTRADIOL, ESTRADIOLPCT, ESTRADIOLFRE                      Note: Lab results reviewed.  Recent Diagnostic Imaging Review  Wrist-L DG Complete:  Results for orders placed during the hospital encounter of 04/12/18  DG Wrist Complete Left   Narrative CLINICAL DATA:  Left posterior wrist pain laceration following a fall today.  EXAM: LEFT WRIST - COMPLETE 3+ VIEW  COMPARISON:  None.  FINDINGS: Shallow linear soft tissue defect in the dorsal aspect of the wrist. Two small infused  ossicles distal to the ulnar styloid. No acute fracture or dislocation.  IMPRESSION: No acute fracture or dislocation.   Electronically Signed   By: Claudie Revering M.D.   On: 04/12/2018 21:36      Complexity Note: Imaging results reviewed. Results shared with Ms. Balow, using Layman's terms.                         Meds   Current Outpatient Medications:  .  aspirin EC 81 MG tablet, Take 81 mg by mouth daily., Disp: , Rfl:  .  atenolol (TENORMIN) 25  MG tablet, Take 25 mg by mouth daily., Disp: , Rfl:  .  atorvastatin (LIPITOR) 20 MG tablet, Take 20 mg by mouth daily., Disp: , Rfl:  .  ibuprofen (ADVIL,MOTRIN) 800 MG tablet, Take 800 mg by mouth every 8 (eight) hours as needed for moderate pain., Disp: , Rfl:  .  NON FORMULARY, Apply 8 mg topically 2 (two) times daily., Disp: , Rfl:  .  oxyCODONE-acetaminophen (PERCOCET/ROXICET) 5-325 MG tablet, Take 1 tablet by mouth daily as needed for moderate pain., Disp: 30 tablet, Rfl: 0 .  methocarbamol (ROBAXIN) 500 MG tablet, Take 1 tablet (500 mg total) by mouth 2 (two) times daily as needed for muscle spasms., Disp: 60 tablet, Rfl: 5  ROS  Constitutional: Denies any fever or chills Gastrointestinal: No reported hemesis, hematochezia, vomiting, or acute GI distress Musculoskeletal: Denies any acute onset joint swelling, redness, loss of ROM, or weakness Neurological: No reported episodes of acute onset apraxia, aphasia, dysarthria, agnosia, amnesia, paralysis, loss of coordination, or loss of consciousness  Allergies  Ms. Ullmer is allergic to toradol [ketorolac tromethamine] and sulfa antibiotics.  PFSH  Drug: Ms. Schwenke  reports no history of drug use. Alcohol:  reports no history of alcohol use. Tobacco:  reports that she has never smoked. She has never used smokeless tobacco. Medical:  has a past medical history of DDD (degenerative disc disease), lumbosacral, Hyperlipidemia, and Hypertension. Surgical: Ms. Peeters  has  a past surgical history that includes Neck surgery; Abdominal hysterectomy; and Nasal reconstruction with septal repair. Family: family history includes Hepatitis in her mother.  Constitutional Exam  General appearance: Well nourished, well developed, and well hydrated. In no apparent acute distress Vitals:   06/17/18 1123  BP: 138/78  Pulse: 79  Resp: 16  Temp: 98.3 F (36.8 C)  TempSrc: Oral  SpO2: 100%  Weight: 170 lb (77.1 kg)  Height: 5' 2.5" (1.588 m)   BMI Assessment: Estimated body mass index is 30.6 kg/m as calculated from the following:   Height as of this encounter: 5' 2.5" (1.588 m).   Weight as of this encounter: 170 lb (77.1 kg).  BMI interpretation table: BMI level Category Range association with higher incidence of chronic pain  <18 kg/m2 Underweight   18.5-24.9 kg/m2 Ideal body weight   25-29.9 kg/m2 Overweight Increased incidence by 20%  30-34.9 kg/m2 Obese (Class I) Increased incidence by 68%  35-39.9 kg/m2 Severe obesity (Class II) Increased incidence by 136%  >40 kg/m2 Extreme obesity (Class III) Increased incidence by 254%   Patient's current BMI Ideal Body weight  Body mass index is 30.6 kg/m. Ideal body weight: 51.3 kg (112 lb 15.8 oz) Adjusted ideal body weight: 61.6 kg (135 lb 12.7 oz)   BMI Readings from Last 4 Encounters:  06/17/18 30.60 kg/m  05/27/18 31.09 kg/m  04/19/18 29.69 kg/m  04/12/18 31.46 kg/m   Wt Readings from Last 4 Encounters:  06/17/18 170 lb (77.1 kg)  05/27/18 170 lb (77.1 kg)  04/19/18 162 lb 4.8 oz (73.6 kg)  04/12/18 172 lb (78 kg)  Psych/Mental status: Alert, oriented x 3 (person, place, & time)       Eyes: PERLA Respiratory: No evidence of acute respiratory distress  Cervical Spine Area Exam  Skin & Axial Inspection: No masses, redness, edema, swelling, or associated skin lesions Alignment: Symmetrical Functional ROM: Pain restricted ROM      Stability: No instability detected Muscle Tone/Strength:  Functionally intact. No obvious neuro-muscular anomalies detected. Sensory (Neurological): Myotome pain pattern Palpation: Complains  of area being tender to palpation Positive provocative maneuver for for cervical facet disease and myofascial pain syndrome  Upper Extremity (UE) Exam    Side: Right upper extremity  Side: Left upper extremity  Skin & Extremity Inspection: Skin color, temperature, and hair growth are WNL. No peripheral edema or cyanosis. No masses, redness, swelling, asymmetry, or associated skin lesions. No contractures.  Skin & Extremity Inspection: Skin color, temperature, and hair growth are WNL. No peripheral edema or cyanosis. No masses, redness, swelling, asymmetry, or associated skin lesions. No contractures.  Functional ROM: Unrestricted ROM          Functional ROM: Unrestricted ROM          Muscle Tone/Strength: Functionally intact. No obvious neuro-muscular anomalies detected.  Muscle Tone/Strength: Functionally intact. No obvious neuro-muscular anomalies detected.  Sensory (Neurological): Unimpaired          Sensory (Neurological): Unimpaired          Palpation: No palpable anomalies              Palpation: No palpable anomalies              Provocative Test(s):  Phalen's test: deferred Tinel's test: deferred Apley's scratch test (touch opposite shoulder):  Action 1 (Across chest): deferred Action 2 (Overhead): deferred Action 3 (LB reach): deferred   Provocative Test(s):  Phalen's test: deferred Tinel's test: deferred Apley's scratch test (touch opposite shoulder):  Action 1 (Across chest): deferred Action 2 (Overhead): deferred Action 3 (LB reach): deferred    Thoracic Spine Area Exam  Skin & Axial Inspection: No masses, redness, or swelling Alignment: Symmetrical Functional ROM: Unrestricted ROM Stability: No instability detected Muscle Tone/Strength: Functionally intact. No obvious neuro-muscular anomalies detected. Sensory (Neurological):  Unimpaired Muscle strength & Tone: No palpable anomalies  Lumbar Spine Area Exam  Skin & Axial Inspection: No masses, redness, or swelling Alignment: Symmetrical Functional ROM: Unrestricted ROM       Stability: No instability detected Muscle Tone/Strength: Functionally intact. No obvious neuro-muscular anomalies detected. Sensory (Neurological): Unimpaired Palpation: No palpable anomalies       Provocative Tests: Hyperextension/rotation test: deferred today       Lumbar quadrant test (Kemp's test): deferred today       Lateral bending test: deferred today       Patrick's Maneuver: deferred today                   FABER* test: deferred today                   S-I anterior distraction/compression test: deferred today         S-I lateral compression test: deferred today         S-I Thigh-thrust test: deferred today         S-I Gaenslen's test: deferred today         *(Flexion, ABduction and External Rotation)  Gait & Posture Assessment  Ambulation: Unassisted Gait: Relatively normal for age and body habitus Posture: WNL   Lower Extremity Exam    Side: Right lower extremity  Side: Left lower extremity  Stability: No instability observed          Stability: No instability observed          Skin & Extremity Inspection: Skin color, temperature, and hair growth are WNL. No peripheral edema or cyanosis. No masses, redness, swelling, asymmetry, or associated skin lesions. No contractures.  Skin & Extremity Inspection: Skin color, temperature, and hair growth  are WNL. No peripheral edema or cyanosis. No masses, redness, swelling, asymmetry, or associated skin lesions. No contractures.  Functional ROM: Unrestricted ROM                  Functional ROM: Unrestricted ROM                  Muscle Tone/Strength: Functionally intact. No obvious neuro-muscular anomalies detected.  Muscle Tone/Strength: Functionally intact. No obvious neuro-muscular anomalies detected.  Sensory (Neurological):  Unimpaired        Sensory (Neurological): Unimpaired        DTR: Patellar: deferred today Achilles: deferred today Plantar: deferred today  DTR: Patellar: deferred today Achilles: deferred today Plantar: deferred today  Palpation: No palpable anomalies  Palpation: No palpable anomalies   Assessment & Plan  Primary Diagnosis & Pertinent Problem List: The primary encounter diagnosis was Chronic pain syndrome. Diagnoses of Hx of fusion of cervical spine, Neck pain, H/O cervical spine surgery (ACDF C5-C7 in [place] 2016), and Myofascial pain syndrome were also pertinent to this visit.  Visit Diagnosis: 1. Chronic pain syndrome   2. Hx of fusion of cervical spine   3. Neck pain   4. H/O cervical spine surgery (ACDF C5-C7 in [place] 2016)   5. Myofascial pain syndrome    Problems updated and reviewed during this visit: Problem  Myofascial Pain Syndrome  Hx of Fusion of Cervical Spine    Very pleasant 53 year old female who moved from [place] history of C5-C7 ACDF as well as cervicalgia left greater than right.  Patient's UDS appropriate.  We will take over her Percocet as below.  Patient takes this very seldomly.  5 mg daily PRN as needed.  She does not report taking this daily.  Usually 30 tablets will last her over 2 months.  Patient will also sign opioid contract today. We will also refill the patient's Robaxin as below.  She takes that twice a day which she states is helpful for muscle spasms.  Patient does have tenderness along her left trapezius and cervical paraspinal muscles.  We discussed trigger point injections for myofascial pain syndrome.  Risk and benefits of this procedure were discussed.  Patient like to proceed.  Plan of Care  Pharmacotherapy (Medications Ordered): Meds ordered this encounter  Medications  . methocarbamol (ROBAXIN) 500 MG tablet    Sig: Take 1 tablet (500 mg total) by mouth 2 (two) times daily as needed for muscle spasms.    Dispense:  60 tablet     Refill:  5  . oxyCODONE-acetaminophen (PERCOCET/ROXICET) 5-325 MG tablet    Sig: Take 1 tablet by mouth daily as needed for moderate pain.    Dispense:  30 tablet    Refill:  0   Lab-work, procedure(s), and/or referral(s): Orders Placed This Encounter  Procedures  . TRIGGER POINT INJECTION    Interventional management options: Planned, scheduled, and/or pending:    Trigger point injection   Considering:   Cervical facet medial branch nerve blocks   PRN Procedures:   To be determined at a later time   Provider-requested follow-up: Return for Procedure.  Time Note: Greater than 50% of the 25 minute(s) of face-to-face time spent with Ms. Birt, was spent in counseling/coordination of care regarding: the appropriate use of the pain scale, Ms. Tesfaye primary cause of pain, the treatment plan, treatment alternatives, the risks and possible complications of proposed treatment, medication side effects, going over the informed consent, the opioid analgesic risks and possible complications, the  appropriate use of her medications, realistic expectations, the goals of pain management (increased in functionality), the medication agreement and the patient's responsibilities when it comes to controlled substances.  Future Appointments  Date Time Provider [place]  08/17/2018 10:30 AM Gillis Santa, MD [facility] None    Primary Care Physician: System, Pcp Not In Location: [facility] Outpatient Pain Management Facility Note by: Gillis Santa, M.D Date: 06/17/2018; Time: 4:15 PM  Patient Instructions  1. Sign opioid contract   2. Follow up for left TPI  3. MM in 2.5 months  Prescriptions for Percocet and Methocarbamol were sent to your pharmacy.

## 2018-06-18 ENCOUNTER — Telehealth: Payer: Self-pay | Admitting: Student in an Organized Health Care Education/Training Program

## 2018-06-18 NOTE — Telephone Encounter (Signed)
Patient lvmail on 06-17-18 stating she could only pick up 7 days meds of Oxycodone. Pharmacy says dosage and quanity do not match. Please check this and let patient know when she can pick up

## 2018-06-18 NOTE — Telephone Encounter (Signed)
Spoke with pharmacy.  They states they need a PA.  PA sent.

## 2018-08-11 ENCOUNTER — Ambulatory Visit
Payer: BLUE CROSS/BLUE SHIELD | Attending: Student in an Organized Health Care Education/Training Program | Admitting: Student in an Organized Health Care Education/Training Program

## 2018-08-11 ENCOUNTER — Other Ambulatory Visit: Payer: Self-pay

## 2018-08-11 DIAGNOSIS — G894 Chronic pain syndrome: Secondary | ICD-10-CM | POA: Diagnosis not present

## 2018-08-11 DIAGNOSIS — M7918 Myalgia, other site: Secondary | ICD-10-CM

## 2018-08-11 DIAGNOSIS — M542 Cervicalgia: Secondary | ICD-10-CM | POA: Diagnosis not present

## 2018-08-11 DIAGNOSIS — Z9889 Other specified postprocedural states: Secondary | ICD-10-CM

## 2018-08-11 DIAGNOSIS — Z981 Arthrodesis status: Secondary | ICD-10-CM | POA: Diagnosis not present

## 2018-08-11 MED ORDER — OXYCODONE-ACETAMINOPHEN 5-325 MG PO TABS: 1.0000 | ORAL_TABLET | Freq: Every day | ORAL | 0 refills | Status: DC | PRN

## 2018-08-11 MED ORDER — METHOCARBAMOL 500 MG PO TABS: 500.0000 mg | ORAL_TABLET | Freq: Two times a day (BID) | ORAL | 5 refills | Status: DC | PRN

## 2018-08-11 NOTE — Progress Notes (Signed)
Pain Management Encounter Note - Virtual Visit via Telephone Telehealth (real-time audio visits between healthcare provider and patient).  Patient's Phone No. & Preferred Pharmacy:  [phone] (home); There is no such number on file (mobile).; (Preferred) [phone]  [id number]- [place], [place] - [address] [place] [place] [address] Phone: [phone] Fax: [phone]   Pre-screening note:  Our staff contacted Ms. Lepak and offered her an "in person", "face-to-face" appointment versus a telephone encounter. She indicated preferring the telephone encounter, at this time.  Reason for Virtual Visit: COVID-19*  Social distancing based on CDC and AMA recommendations.   I contacted Shannon SiDeanna Burback on 08/11/2018 at 11:07 AM by telephone and clearly identified myself as Edward JollyBilal Hayli Milligan, MD. I verified that I was speaking with the correct person using two identifiers (Name and date of birth: 04/12/1966).  Advanced Informed Consent I sought verbal advanced consent from Shannon Sieanna Fessenden for telemedicine interactions and virtual visit. I informed Ms. Richens of the security and privacy concerns, risks, and limitations associated with performing an evaluation and management service by telephone. I also informed Ms. Gulden of the availability of "in person" appointments and I informed her of the possibility of a patient responsible charge related to this service. [name] expressed understanding and agreed to proceed.   Historic Elements   [name] is a 53 y.o. year old, female patient evaluated today after her last encounter by our practice on 06/18/2018. [name]  has a past medical history of DDD (degenerative disc disease), lumbosacral, Hyperlipidemia, and Hypertension. She also  has a past surgical history that includes Neck surgery; Abdominal hysterectomy; and Nasal reconstruction with septal repair. [name] has a current medication list  which includes the following prescription(s): aspirin ec, atenolol, atorvastatin, ibuprofen, methocarbamol, NON FORMULARY, and oxycodone-acetaminophen. She  reports that she has never smoked. She has never used smokeless tobacco. She reports that she does not drink alcohol or use drugs. [name] is allergic to toradol [ketorolac tromethamine] and sulfa antibiotics.   HPI  I last saw her on 06/18/2018. She is being evaluated for medication management.   Pharmacotherapy Assessment   06/18/2018  1   06/17/2018  Oxycodone-Acetaminophen 5-325  30.00 30 Bi Lat   [id number]   Nor (4618)   0  7.50 MME  Comm Ins   [place]     Monitoring: Pharmacotherapy: No side-effects or adverse reactions reported. [place] PMP: PDMP reviewed during this encounter.       Compliance: No problems identified. Plan: Refer to "POC".  Review of recent tests  DG Wrist Complete Left CLINICAL DATA:  Left posterior wrist pain laceration following a fall today.  EXAM: LEFT WRIST - COMPLETE 3+ VIEW  COMPARISON:  None.  FINDINGS: Shallow linear soft tissue defect in the dorsal aspect of the wrist. Two small infused ossicles distal to the ulnar styloid. No acute fracture or dislocation.  IMPRESSION: No acute fracture or dislocation.  Electronically Signed   By: Beckie SaltsSteven  Reid M.D.   On: 04/12/2018 21:36   Office Visit on 05/27/2018  Component Date Value Ref Range Status  . Summary 05/27/2018 FINAL   Final   Comment: ==================================================================== TOXASSURE COMP DRUG ANALYSIS,UR ==================================================================== Test                             Result       Flag       Units Drug Present and Declared for Prescription  Verification   Methocarbamol                  PRESENT      EXPECTED   Acetaminophen                  PRESENT      EXPECTED   Atenolol                       PRESENT      EXPECTED Drug Present not Declared for Prescription  Verification   Carboxy-THC                    20           UNEXPECTED ng/mg creat    Carboxy-THC is a metabolite of tetrahydrocannabinol  (THC).    Source of [facility] is most commonly illicit, but THC is also present    in a scheduled prescription medication.   Diphenhydramine                PRESENT      UNEXPECTED   Doxylamine                     PRESENT      UNEXPECTED   Dextromethorphan               PRESENT      UNEXPECTED   Dextrorphan/Levorphanol        PRESENT      UNEXPECTED                              Dextrorphan is an expected metabolite of dextromethorphan, an    over-the-counter or prescription cough suppressant. Dextrorphan    cannot be distinguished from the scheduled prescription    medication levorphanol by the method used for analysis. Drug Absent but Declared for Prescription Verification   Oxycodone                      Not Detected UNEXPECTED ng/mg creat   Salicylate                     Not Detected UNEXPECTED    Aspirin, as indicated in the declared medication list, is not    always detected even when used as directed.   Ibuprofen                      Not Detected UNEXPECTED    Ibuprofen, as indicated in the declared medication list, is not    always detected even when used as directed. ==================================================================== Test                      Result    Flag   Units      Ref Range   Creatinine              59               mg/dL      >=80 ==================================================================== Declared Med                          ications:  The flagging and interpretation on this report are based on the  following declared medications.  Unexpected results may arise from  inaccuracies in the declared medications.  **Note: The testing  scope of this panel includes these medications:  Atenolol  Methocarbamol (Robaxin)  Oxycodone (Percocet)  **Note: The testing scope of this panel does not include small to  moderate  amounts of these reported medications:  Acetaminophen (Percocet)  Aspirin (Aspirin 81)  Ibuprofen  **Note: The testing scope of this panel does not include following  reported medications:  Atorvastatin (Lipitor) ==================================================================== For clinical consultation, please call [phone]. ====================================================================    Assessment  The primary encounter diagnosis was Chronic pain syndrome. Diagnoses of Hx of fusion of cervical spine, Neck pain, H/O cervical spine surgery (ACDF C5-C7 in [place] 2016), and Myofascial pain syndrome were also pertinent to this visit.  Plan of Care  I have changed Tineka Sebastiano's oxyCODONE-acetaminophen. I am also having her maintain her atenolol, atorvastatin, ibuprofen, NON FORMULARY, aspirin EC, and methocarbamol.  Pharmacotherapy (Medications Ordered): Meds ordered this encounter  Medications  . oxyCODONE-acetaminophen (PERCOCET/ROXICET) 5-325 MG tablet    Sig: Take 1-2 tablets by mouth daily as needed for moderate pain.    Dispense:  60 tablet    Refill:  0  . methocarbamol (ROBAXIN) 500 MG tablet    Sig: Take 1 tablet (500 mg total) by mouth 2 (two) times daily as needed for muscle spasms.    Dispense:  60 tablet    Refill:  5   Consider cervical and trapezius trigger point injections in the future.  Follow-up plan:   Return if symptoms worsen or fail to improve.   I discussed the assessment and treatment plan with the patient. The patient was provided an opportunity to ask questions and all were answered. The patient agreed with the plan and demonstrated an understanding of the instructions.  Patient advised to call back or seek an in-person evaluation if the symptoms or condition worsens.  Total duration of non-face-to-face encounter: 17 minutes.  Note by: Edward Jolly, MD Date: 08/11/2018; Time: 11:07 AM  Disclaimer:  * Given the special  circumstances of the COVID-19 pandemic, the federal government has announced that the Office for Civil Rights (OCR) will exercise its enforcement discretion and will not impose penalties on physicians using telehealth in the event of noncompliance with regulatory requirements under the [employer] Portability and Accountability Act (HIPAA) in connection with the good faith provision of telehealth during the COVID-19 national public health emergency. (AMA)

## 2018-08-17 ENCOUNTER — Encounter: Payer: BLUE CROSS/BLUE SHIELD | Admitting: Student in an Organized Health Care Education/Training Program

## 2018-11-16 DIAGNOSIS — E66812 Obesity, class 2: Secondary | ICD-10-CM | POA: Insufficient documentation

## 2018-11-17 ENCOUNTER — Encounter: Payer: Self-pay | Admitting: Student in an Organized Health Care Education/Training Program

## 2018-11-18 ENCOUNTER — Encounter: Payer: Self-pay | Admitting: Student in an Organized Health Care Education/Training Program

## 2018-11-18 ENCOUNTER — Ambulatory Visit
Payer: BC Managed Care – PPO | Attending: Student in an Organized Health Care Education/Training Program | Admitting: Student in an Organized Health Care Education/Training Program

## 2018-11-18 DIAGNOSIS — M542 Cervicalgia: Secondary | ICD-10-CM | POA: Diagnosis not present

## 2018-11-18 DIAGNOSIS — G894 Chronic pain syndrome: Secondary | ICD-10-CM | POA: Diagnosis not present

## 2018-11-18 DIAGNOSIS — M7918 Myalgia, other site: Secondary | ICD-10-CM | POA: Diagnosis not present

## 2018-11-18 DIAGNOSIS — Z981 Arthrodesis status: Secondary | ICD-10-CM

## 2018-11-18 DIAGNOSIS — Z9889 Other specified postprocedural states: Secondary | ICD-10-CM | POA: Diagnosis not present

## 2018-11-18 MED ORDER — OXYCODONE-ACETAMINOPHEN 5-325 MG PO TABS: 1.0000 | ORAL_TABLET | Freq: Every day | ORAL | 0 refills | Status: DC | PRN

## 2018-11-18 MED ORDER — METHOCARBAMOL 500 MG PO TABS: 500.0000 mg | ORAL_TABLET | Freq: Two times a day (BID) | ORAL | 5 refills | Status: DC | PRN

## 2018-11-18 NOTE — Progress Notes (Signed)
Pain Management Virtual Encounter Note - Virtual Visit via Telephone Telehealth (real-time audio visits between healthcare provider and patient).   Patient's Phone No. & Preferred Pharmacy:  [phone] (home); There is no such number on file (mobile).; (Preferred) [phone] [email or web address]  [facility] [id number] - [address] [place] [place] [address] Phone: [phone] Fax: [phone]    Pre-screening note:  Our staff contacted [name] and offered her an "in person", "face-to-face" appointment versus a telephone encounter. She indicated preferring the telephone encounter, at this time.   Reason for Virtual Visit: COVID-19*  Social distancing based on CDC and AMA recommendations.   I contacted Shannon SiDeanna Zambrana on 11/18/2018 via telephone.      I clearly identified myself as Edward JollyBilal Lem Peary, MD. I verified that I was speaking with the correct person using two identifiers (Name: Shannon Cortez, and date of birth: 08/02/1965).  Advanced Informed Consent I sought verbal advanced consent from Shannon Cortez for virtual visit interactions. I informed Ms. Morren of possible security and privacy concerns, risks, and limitations associated with providing "not-in-person" medical evaluation and management services. I also informed Ms. Michels of the availability of "in-person" appointments. Finally, I informed her that there would be a charge for the virtual visit and that she could be  personally, fully or partially, financially responsible for it. Shannon Cortez expressed understanding and agreed to proceed.   Historic Elements   Shannon Cortez is a 53 y.o. year old, female patient evaluated today after her last encounter by our practice on 08/11/2018. Shannon Cortez  has a past medical history of DDD (degenerative disc disease), lumbosacral, Hyperlipidemia, and Hypertension. She also  has a past surgical history that includes Neck surgery;  Abdominal hysterectomy; and Nasal reconstruction with septal repair. Shannon Cortez has a current medication list which includes the following prescription(s): aspirin ec, atorvastatin, hydrochlorothiazide, ibuprofen, methocarbamol, NON FORMULARY, oxycodone-acetaminophen, and atenolol. She  reports that she quit smoking about 4 years ago. She has never used smokeless tobacco. She reports that she does not drink alcohol or use drugs. Shannon Cortez is allergic to toradol [ketorolac tromethamine] and sulfa antibiotics.   HPI  Today, she is being contacted for medication management.   No change in medical history since last visit.  Patient's pain is at baseline.  Patient continues multimodal pain regimen as prescribed.  States that it provides pain relief and improvement in functional status.   Pharmacotherapy Assessment   08/11/2018  2   08/11/2018  Oxycodone-Acetaminophen 5-325  60.00 30 Bi Lat   [id number]   Nor (4618)   0  15.00 MME  Comm Ins   [place]    Monitoring: Pharmacotherapy: No side-effects or adverse reactions reported. [place] PMP: PDMP reviewed during this encounter.       Compliance: No problems identified. Effectiveness: Clinically acceptable. Plan: Refer to "POC".  Pertinent Labs   SAFETY SCREENING Profile No results found for: SARSCOV2NAA, COVIDSOURCE, STAPHAUREUS, MRSAPCR, HCVAB, HIV, PREGTESTUR Renal Function Lab Results  Component Value Date   BUN 13 04/18/2018   CREATININE 0.88 04/18/2018   GFRAA >60 04/18/2018   GFRNONAA >60 04/18/2018   Hepatic Function Lab Results  Component Value Date   AST 22 04/18/2018   ALT 21 04/18/2018   ALBUMIN 3.6 04/18/2018   UDS Summary  Date Value Ref Range Status  05/27/2018 FINAL  Final    Comment:    ==================================================================== TOXASSURE COMP DRUG ANALYSIS,UR ==================================================================== Test  Result       Flag        Units Drug Present and Declared for Prescription Verification   Methocarbamol                  PRESENT      EXPECTED   Acetaminophen                  PRESENT      EXPECTED   Atenolol                       PRESENT      EXPECTED Drug Present not Declared for Prescription Verification   Carboxy-THC                    20           UNEXPECTED ng/mg creat    Carboxy-THC is a metabolite of tetrahydrocannabinol  (THC).    Source of [facility] is most commonly illicit, but THC is also present    in a scheduled prescription medication.   Diphenhydramine                PRESENT      UNEXPECTED   Doxylamine                     PRESENT      UNEXPECTED   Dextromethorphan               PRESENT      UNEXPECTED   Dextrorphan/Levorphanol        PRESENT      UNEXPECTED    Dextrorphan is an expected metabolite of dextromethorphan, an    over-the-counter or prescription cough suppressant. Dextrorphan    cannot be distinguished from the scheduled prescription    medication levorphanol by the method used for analysis. Drug Absent but Declared for Prescription Verification   Oxycodone                      Not Detected UNEXPECTED ng/mg creat   Salicylate                     Not Detected UNEXPECTED    Aspirin, as indicated in the declared medication list, is not    always detected even when used as directed.   Ibuprofen                      Not Detected UNEXPECTED    Ibuprofen, as indicated in the declared medication list, is not    always detected even when used as directed. ==================================================================== Test                      Result    Flag   Units      Ref Range   Creatinine              59               mg/dL      >=20 ==================================================================== Declared Medications:  The flagging and interpretation on this report are based on the  following declared medications.  Unexpected results may arise from  inaccuracies in the declared  medications.  **Note: The testing scope of this panel includes these medications:  Atenolol  Methocarbamol (Robaxin)  Oxycodone (Percocet)  **Note: The testing scope of this panel does not include small to  moderate  amounts of these reported medications:  Acetaminophen (Percocet)  Aspirin (Aspirin 81)  Ibuprofen  **Note: The testing scope of this panel does not include following  reported medications:  Atorvastatin (Lipitor) ==================================================================== For clinical consultation, please call [phone]. ====================================================================    Note: Above Lab results reviewed.  Recent imaging  DG Wrist Complete Left CLINICAL DATA:  Left posterior wrist pain laceration following a fall today.  EXAM: LEFT WRIST - COMPLETE 3+ VIEW  COMPARISON:  None.  FINDINGS: Shallow linear soft tissue defect in the dorsal aspect of the wrist. Two small infused ossicles distal to the ulnar styloid. No acute fracture or dislocation.  IMPRESSION: No acute fracture or dislocation.  Electronically Signed   By: Beckie SaltsSteven  Reid M.D.   On: 04/12/2018 21:36  Assessment  The primary encounter diagnosis was Chronic pain syndrome. Diagnoses of Hx of fusion of cervical spine, Neck pain, H/O cervical spine surgery (ACDF C5-C7 in [place] 2016), and Myofascial pain syndrome were also pertinent to this visit.  Plan of Care  I am having Shannon Cortez maintain her atenolol, atorvastatin, ibuprofen, NON FORMULARY, aspirin EC, hydrochlorothiazide, methocarbamol, and oxyCODONE-acetaminophen.  Pharmacotherapy (Medications Ordered): Meds ordered this encounter  Medications  . methocarbamol (ROBAXIN) 500 MG tablet    Sig: Take 1 tablet (500 mg total) by mouth 2 (two) times daily as needed for muscle spasms.    Dispense:  60 tablet    Refill:  5  . oxyCODONE-acetaminophen (PERCOCET/ROXICET) 5-325 MG tablet    Sig: Take 1-2  tablets by mouth daily as needed for moderate pain.    Dispense:  60 tablet    Refill:  0    Follow-up plan:   Return in about 3 months (around 02/18/2019) for Medication Management.    Recent Visits No visits were found meeting these conditions.  Showing recent visits within past 90 days and meeting all other requirements   Today's Visits Date Type Provider Dept  11/18/18 Office Visit Edward JollyLateef, Brylin Stopper, MD [facility]  Showing today's visits and meeting all other requirements   Future Appointments No visits were found meeting these conditions.  Showing future appointments within next 90 days and meeting all other requirements   I discussed the assessment and treatment plan with the patient. The patient was provided an opportunity to ask questions and all were answered. The patient agreed with the plan and demonstrated an understanding of the instructions.  Patient advised to call back or seek an in-person evaluation if the symptoms or condition worsens.  Total duration of non-face-to-face encounter: 25minutes.  Note by: Edward JollyBilal Inaki Vantine, MD Date: 11/18/2018; Time: 11:50 AM  Note: This dictation was prepared with Dragon dictation. Any transcriptional errors that may result from this process are unintentional.  Disclaimer:  * Given the special circumstances of the COVID-19 pandemic, the federal government has announced that the Office for Civil Rights (OCR) will exercise its enforcement discretion and will not impose penalties on physicians using telehealth in the event of noncompliance with regulatory requirements under the [employer] Portability and Accountability Act (HIPAA) in connection with the good faith provision of telehealth during the COVID-19 national public health emergency. (AMA)

## 2019-02-14 ENCOUNTER — Encounter: Payer: Self-pay | Admitting: Student in an Organized Health Care Education/Training Program

## 2019-02-15 ENCOUNTER — Other Ambulatory Visit: Payer: Self-pay

## 2019-02-15 ENCOUNTER — Ambulatory Visit
Payer: BC Managed Care – PPO | Attending: Student in an Organized Health Care Education/Training Program | Admitting: Student in an Organized Health Care Education/Training Program

## 2019-02-15 DIAGNOSIS — M7918 Myalgia, other site: Secondary | ICD-10-CM

## 2019-02-15 DIAGNOSIS — M542 Cervicalgia: Secondary | ICD-10-CM | POA: Diagnosis not present

## 2019-02-15 DIAGNOSIS — G894 Chronic pain syndrome: Secondary | ICD-10-CM | POA: Diagnosis not present

## 2019-02-15 DIAGNOSIS — Z981 Arthrodesis status: Secondary | ICD-10-CM | POA: Diagnosis not present

## 2019-02-15 DIAGNOSIS — Z9889 Other specified postprocedural states: Secondary | ICD-10-CM | POA: Diagnosis not present

## 2019-02-15 MED ORDER — OXYCODONE-ACETAMINOPHEN 5-325 MG PO TABS: 1.0000 | ORAL_TABLET | Freq: Every day | ORAL | 0 refills | Status: DC | PRN

## 2019-02-15 MED ORDER — METHOCARBAMOL 500 MG PO TABS: 500.0000 mg | ORAL_TABLET | Freq: Two times a day (BID) | ORAL | 5 refills | Status: AC | PRN

## 2019-02-15 NOTE — Progress Notes (Signed)
Pain Management Virtual Encounter Note - Virtual Visit via Video Conference Telehealth (real-time audio visits between healthcare provider and patient).   Patient's Phone No. & Preferred Pharmacy:  [phone] (home); There is no such number on file (mobile).; (Preferred) [phone] [email or web address]  [facility] [phone] [facility], [place] - [address] [place] [address] Phone: [phone] Fax: [phone]    Pre-screening note:  Our staff contacted Shannon Cortez and offered her an "in person", "face-to-face" appointment versus a telephone encounter. She indicated preferring the telephone encounter, at this time.   Reason for Virtual Visit: COVID-19*  Social distancing based on CDC and AMA recommendations.   I contacted Shannon Cortez on 02/15/2019 via video conference.      I clearly identified myself as Edward Jolly, MD. I verified that I was speaking with the correct person using two identifiers (Name: Shannon Cortez, and date of birth: 12-Sep-1965).  Advanced Informed Consent I sought verbal advanced consent from Shannon Cortez for virtual visit interactions. I informed Shannon Cortez of possible security and privacy concerns, risks, and limitations associated with providing "not-in-person" medical evaluation and management services. I also informed Shannon Cortez of the availability of "in-person" appointments. Finally, I informed her that there would be a charge for the virtual visit and that she could be  personally, fully or partially, financially responsible for it. Shannon Cortez expressed understanding and agreed to proceed.   Historic Elements   Shannon Cortez is a 53 y.o. year old, female patient evaluated today after her last encounter by our practice on 11/18/2018. Shannon Cortez  has a past medical history of DDD (degenerative disc disease), lumbosacral, Hyperlipidemia, and Hypertension. She also  has a past surgical history that includes  Neck surgery; Abdominal hysterectomy; and Nasal reconstruction with septal repair. [name] has a current medication list which includes the following prescription(s): aspirin ec, atorvastatin, hydrochlorothiazide, ibuprofen, methocarbamol, NON FORMULARY, oxycodone-acetaminophen, and atenolol. She  reports that she quit smoking about 4 years ago. She has never used smokeless tobacco. She reports that she does not drink alcohol or use drugs. Shannon Cortez is allergic to toradol [ketorolac tromethamine] and sulfa antibiotics.   HPI  Today, she is being contacted for medication management.   No change in medical history since last visit.  Patient's pain is at baseline.  Patient continues multimodal pain regimen as prescribed.  States that it provides pain relief and improvement in functional status.   Pharmacotherapy Assessment  Analgesic:  11/18/2018  1   11/18/2018  Oxycodone-Acetaminophen 5-325  60.00  30 Bi Lat   [id number]   Nor (4618)   0  15.00 MME  Comm Ins   [place]   1 Rx lasts 3 months; 8 tablets left   Monitoring: Pharmacotherapy: No side-effects or adverse reactions reported. [place] PMP: PDMP reviewed during this encounter.       Compliance: No problems identified. Effectiveness: Clinically acceptable. Plan: Refer to "POC".  UDS:  Summary  Date Value Ref Range Status  05/27/2018 FINAL  Final    Comment:    ==================================================================== TOXASSURE COMP DRUG ANALYSIS,UR ==================================================================== Test                             Result       Flag       Units Drug Present and Declared for Prescription Verification   Methocarbamol  PRESENT      EXPECTED   Acetaminophen                  PRESENT      EXPECTED   Atenolol                       PRESENT      EXPECTED Drug Present not Declared for Prescription Verification   Carboxy-THC                    20           UNEXPECTED ng/mg creat     Carboxy-THC is a metabolite of tetrahydrocannabinol  (THC).    Source of [facility] is most commonly illicit, but THC is also present    in a scheduled prescription medication.   Diphenhydramine                PRESENT      UNEXPECTED   Doxylamine                     PRESENT      UNEXPECTED   Dextromethorphan               PRESENT      UNEXPECTED   Dextrorphan/Levorphanol        PRESENT      UNEXPECTED    Dextrorphan is an expected metabolite of dextromethorphan, an    over-the-counter or prescription cough suppressant. Dextrorphan    cannot be distinguished from the scheduled prescription    medication levorphanol by the method used for analysis. Drug Absent but Declared for Prescription Verification   Oxycodone                      Not Detected UNEXPECTED ng/mg creat   Salicylate                     Not Detected UNEXPECTED    Aspirin, as indicated in the declared medication list, is not    always detected even when used as directed.   Ibuprofen                      Not Detected UNEXPECTED    Ibuprofen, as indicated in the declared medication list, is not    always detected even when used as directed. ==================================================================== Test                      Result    Flag   Units      Ref Range   Creatinine              59               mg/dL      >=20 ==================================================================== Declared Medications:  The flagging and interpretation on this report are based on the  following declared medications.  Unexpected results may arise from  inaccuracies in the declared medications.  **Note: The testing scope of this panel includes these medications:  Atenolol  Methocarbamol (Robaxin)  Oxycodone (Percocet)  **Note: The testing scope of this panel does not include small to  moderate amounts of these reported medications:  Acetaminophen (Percocet)  Aspirin (Aspirin 81)  Ibuprofen  **Note: The testing scope of this panel  does not include following  reported medications:  Atorvastatin (Lipitor) ==================================================================== For clinical consultation, please call [phone]. ====================================================================  Laboratory Chemistry Profile (12 mo)  Renal: 04/18/2018: BUN 13; Creatinine, Ser 0.88  Lab Results  Component Value Date   GFRAA >60 04/18/2018   GFRNONAA >60 04/18/2018   Hepatic: 04/18/2018: Albumin 3.6 Lab Results  Component Value Date   AST 22 04/18/2018   ALT 21 04/18/2018   Other: No results found for requested labs within last 8760 hours. Note: Above Lab results reviewed.   Assessment  The primary encounter diagnosis was Chronic pain syndrome. Diagnoses of Hx of fusion of cervical spine, Neck pain, H/O cervical spine surgery (ACDF C5-C7 in [place] 2016), and Myofascial pain syndrome were also pertinent to this visit.  Plan of Care  I am having Shannon Cortez maintain her atenolol, atorvastatin, ibuprofen, NON FORMULARY, aspirin EC, hydrochlorothiazide, oxyCODONE-acetaminophen, and methocarbamol.  Pharmacotherapy (Medications Ordered): Meds ordered this encounter  Medications  . oxyCODONE-acetaminophen (PERCOCET/ROXICET) 5-325 MG tablet    Sig: Take 1-2 tablets by mouth daily as needed for moderate pain.    Dispense:  60 tablet    Refill:  0  . methocarbamol (ROBAXIN) 500 MG tablet    Sig: Take 1 tablet (500 mg total) by mouth 2 (two) times daily as needed for muscle spasms.    Dispense:  60 tablet    Refill:  5   Follow-up plan:   Return in about 3 months (around 05/18/2019) for Medication Management, in person UDS.    Recent Visits Date Type Provider Dept  11/18/18 Office Visit Edward JollyLateef, Sherran Margolis, MD [facility]  Showing recent visits within past 90 days and meeting all other requirements   Today's Visits Date Type Provider Dept  02/15/19 Office Visit Edward JollyLateef, Kourtland Coopman, MD [facility]  Showing today's visits and meeting all other requirements   Future Appointments No visits were found meeting these conditions.  Showing future appointments within next 90 days and meeting all other requirements   I discussed the assessment and treatment plan with the patient. The patient was provided an opportunity to ask questions and all were answered. The patient agreed with the plan and demonstrated an understanding of the instructions.  Patient advised to call back or seek an in-person evaluation if the symptoms or condition worsens.  Total duration of non-face-to-face encounter: 25 minutes.  Note by: Edward JollyBilal Jeanann Balinski, MD Date: 02/15/2019; Time: 11:33 AM  Note: This dictation was prepared with Dragon dictation. Any transcriptional errors that may result from this process are unintentional.  Disclaimer:  * Given the special circumstances of the COVID-19 pandemic, the federal government has announced that the Office for Civil Rights (OCR) will exercise its enforcement discretion and will not impose penalties on physicians using telehealth in the event of noncompliance with regulatory requirements under the [employer] Insurance Portability and Accountability Act (HIPAA) in connection with the good faith provision of telehealth during the COVID-19 national public health emergency. (AMA)

## 2019-02-17 ENCOUNTER — Encounter: Payer: BC Managed Care – PPO | Admitting: Student in an Organized Health Care Education/Training Program

## 2019-05-17 ENCOUNTER — Telehealth: Payer: Self-pay | Admitting: *Deleted

## 2019-05-17 ENCOUNTER — Ambulatory Visit
Payer: BC Managed Care – PPO | Attending: Student in an Organized Health Care Education/Training Program | Admitting: Student in an Organized Health Care Education/Training Program

## 2019-05-17 ENCOUNTER — Encounter: Payer: Self-pay | Admitting: Student in an Organized Health Care Education/Training Program

## 2019-05-17 ENCOUNTER — Other Ambulatory Visit: Payer: Self-pay

## 2019-05-17 DIAGNOSIS — Z9889 Other specified postprocedural states: Secondary | ICD-10-CM

## 2019-05-17 DIAGNOSIS — Z981 Arthrodesis status: Secondary | ICD-10-CM

## 2019-05-17 DIAGNOSIS — G894 Chronic pain syndrome: Secondary | ICD-10-CM

## 2019-05-17 DIAGNOSIS — M7918 Myalgia, other site: Secondary | ICD-10-CM | POA: Diagnosis not present

## 2019-05-17 DIAGNOSIS — M542 Cervicalgia: Secondary | ICD-10-CM

## 2019-05-17 MED ORDER — OXYCODONE-ACETAMINOPHEN 5-325 MG PO TABS: 1.0000 | ORAL_TABLET | Freq: Every day | ORAL | 0 refills | Status: DC | PRN

## 2019-05-17 NOTE — Progress Notes (Signed)
Patient: Shannon Cortez  Service Category: E/M  Provider: Edward Jolly, MD  DOB: [birth year]  DOS: 05/17/2019  Location: Office  MRN: [id number]  Setting: Ambulatory outpatient  Referring Provider: No ref. provider found  Type: Established Patient  Specialty: Interventional Pain Management  PCP: System, Pcp Not In  Location: Home  Delivery: TeleHealth     Virtual Encounter - Pain Management PROVIDER NOTE: Information contained herein reflects review and annotations entered in association with encounter. Interpretation of such information and data should be left to medically-trained personnel. Information provided to patient can be located elsewhere in the medical record under "Patient Instructions". Document created using STT-dictation technology, any transcriptional errors that may result from process are unintentional.    Contact & Pharmacy Preferred: [phone] Home: [phone] (home) Mobile: There is no such number on file (mobile). E-mail: [email or web address]  [phone] [facility], [place] - [address] [place] [address] Phone: [phone] Fax: [phone]   Pre-screening  Shannon Cortez offered "in-person" vs "virtual" encounter. She indicated preferring virtual for this encounter.   Reason COVID-19*  Social distancing based on CDC and AMA recommendations.   I contacted Shannon Cortez on 05/17/2019 via telephone.      I clearly identified myself as Edward Jolly, MD. I verified that I was speaking with the correct person using two identifiers (Name: Shannon Cortez, and date of birth: 18-Jun-1965).  This visit was completed via telephone due to the restrictions of the COVID-19 pandemic. All issues as above were discussed and addressed but no physical exam was performed. If it was felt that the patient should be evaluated in the office, they were directed there. The patient verbally consented to this visit. Patient was unable to complete an  audio/visual visit due to Technical difficulties and/or Lack of internet. Due to the catastrophic nature of the COVID-19 pandemic, this visit was done through audio contact only.  Location of the patient: home address (see Epic for details)  Location of the provider: office  Consent I sought verbal advanced consent from Shannon Cortez for virtual visit interactions. I informed Shannon Cortez of possible security and privacy concerns, risks, and limitations associated with providing "not-in-person" medical evaluation and management services. I also informed Shannon Cortez of the availability of "in-person" appointments. Finally, I informed her that there would be a charge for the virtual visit and that she could be  personally, fully or partially, financially responsible for it. Shannon Cortez expressed understanding and agreed to proceed.   Historic Elements   Shannon Cortez is a 54 y.o. year old, female patient evaluated today after her last encounter by our practice on 02/15/2019. Shannon Cortez  has a past medical history of DDD (degenerative disc disease), lumbosacral, Hyperlipidemia, and Hypertension. She also  has a past surgical history that includes Neck surgery; Abdominal hysterectomy; and Nasal reconstruction with septal repair. Shannon Cortez has a current medication list which includes the following prescription(s): aspirin ec, atorvastatin, hydrochlorothiazide, ibuprofen, losartan, methocarbamol, NON FORMULARY, oxycodone-acetaminophen, and atenolol. She  reports that she quit smoking about 5 years ago. She has never used smokeless tobacco. She reports that she does not drink alcohol or use drugs. Shannon Cortez is allergic to toradol [ketorolac tromethamine] and sulfa antibiotics.   HPI  Today, she is being contacted for medication management.   BP meds adjusted by PCP since last visit. Otherwise Patient's pain is at baseline.  Patient continues multimodal pain regimen as prescribed.   States that it provides pain relief and  improvement in functional status.  Pharmacotherapy Assessment  Analgesic: 02/15/2019  2   02/15/2019  Oxycodone-Acetaminophen 5-325  60.00  30 Bi Lat   [id number]   Nor (4618)   0  15.00 MME  Comm Ins   [place]     Monitoring: Pharmacotherapy: No side-effects or adverse reactions reported. [place] PMP: PDMP reviewed during this encounter.       Compliance: No problems identified. Effectiveness: Clinically acceptable. Plan: Refer to "POC".  UDS:  Summary  Date Value Ref Range Status  05/27/2018 FINAL  Final    Comment:    ==================================================================== TOXASSURE COMP DRUG ANALYSIS,UR ==================================================================== Test                             Result       Flag       Units Drug Present and Declared for Prescription Verification   Methocarbamol                  PRESENT      EXPECTED   Acetaminophen                  PRESENT      EXPECTED   Atenolol                       PRESENT      EXPECTED Drug Present not Declared for Prescription Verification   Carboxy-THC                    20           UNEXPECTED ng/mg creat    Carboxy-THC is a metabolite of tetrahydrocannabinol  (THC).    Source of [facility] is most commonly illicit, but THC is also present    in a scheduled prescription medication.   Diphenhydramine                PRESENT      UNEXPECTED   Doxylamine                     PRESENT      UNEXPECTED   Dextromethorphan               PRESENT      UNEXPECTED   Dextrorphan/Levorphanol        PRESENT      UNEXPECTED    Dextrorphan is an expected metabolite of dextromethorphan, an    over-the-counter or prescription cough suppressant. Dextrorphan    cannot be distinguished from the scheduled prescription    medication levorphanol by the method used for analysis. Drug Absent but Declared for Prescription Verification   Oxycodone                      Not Detected UNEXPECTED ng/mg  creat   Salicylate                     Not Detected UNEXPECTED    Aspirin, as indicated in the declared medication list, is not    always detected even when used as directed.   Ibuprofen                      Not Detected UNEXPECTED    Ibuprofen, as indicated in the declared medication list, is not    always detected even when used as directed. ==================================================================== Test  Result    Flag   Units      Ref Range   Creatinine              59               mg/dL      >=38 ==================================================================== Declared Medications:  The flagging and interpretation on this report are based on the  following declared medications.  Unexpected results may arise from  inaccuracies in the declared medications.  **Note: The testing scope of this panel includes these medications:  Atenolol  Methocarbamol (Robaxin)  Oxycodone (Percocet)  **Note: The testing scope of this panel does not include small to  moderate amounts of these reported medications:  Acetaminophen (Percocet)  Aspirin (Aspirin 81)  Ibuprofen  **Note: The testing scope of this panel does not include following  reported medications:  Atorvastatin (Lipitor) ==================================================================== For clinical consultation, please call [phone]. ====================================================================    Laboratory Chemistry Profile (12 mo)  Renal: No results found for requested labs within last 8760 hours.  Lab Results  Component Value Date   GFRAA >60 04/18/2018   GFRNONAA >60 04/18/2018   Hepatic: No results found for requested labs within last 8760 hours. Lab Results  Component Value Date   AST 22 04/18/2018   ALT 21 04/18/2018   Other: No results found for requested labs within last 8760 hours. Note: Above Lab results reviewed.  Imaging  DG Wrist Complete Left CLINICAL  DATA:  Left posterior wrist pain laceration following a fall today.  EXAM: LEFT WRIST - COMPLETE 3+ VIEW  COMPARISON:  None.  FINDINGS: Shallow linear soft tissue defect in the dorsal aspect of the wrist. Two small infused ossicles distal to the ulnar styloid. No acute fracture or dislocation.  IMPRESSION: No acute fracture or dislocation.  Electronically Signed   By: Beckie Salts M.D.   On: 04/12/2018 21:36   Assessment  The primary encounter diagnosis was Chronic pain syndrome. Diagnoses of Hx of fusion of cervical spine, H/O cervical spine surgery (ACDF C5-C7 in [place]), Myofascial pain syndrome, and Neck pain were also pertinent to this visit.  Plan of Care   I am having Shannon Cortez maintain her atenolol, atorvastatin, ibuprofen, NON FORMULARY, aspirin EC, hydrochlorothiazide, methocarbamol, losartan, and oxyCODONE-acetaminophen.  Pharmacotherapy (Medications Ordered): Meds ordered this encounter  Medications  . oxyCODONE-acetaminophen (PERCOCET/ROXICET) 5-325 MG tablet    Sig: Take 1-2 tablets by mouth daily as needed for moderate pain.    Dispense:  60 tablet    Refill:  0   Orders:  Orders Placed This Encounter  Procedures  . UDS (Compliance-13) ([facility]) ([facility]) (Established Pt.)    Volume: 30 ml(s). Minimum 3 ml of urine is needed. Document temperature of fresh sample. Indications: Long term (current) use of opiate analgesic (A19.379)   Follow-up plan:   Return in about 3 months (around 08/15/2019) for Medication Management.    Recent Visits No visits were found meeting these conditions.  Showing recent visits within past 90 days and meeting all other requirements   Today's Visits Date Type Provider Dept  05/17/19 Office Visit Edward Jolly, MD [facility]  Showing today's visits and meeting all other requirements   Future Appointments No visits were found meeting these conditions.  Showing future appointments within next  90 days and meeting all other requirements   I discussed the assessment and treatment plan with the patient. The patient was provided an opportunity to ask questions and all were answered. The  patient agreed with the plan and demonstrated an understanding of the instructions.  Patient advised to call back or seek an in-person evaluation if the symptoms or condition worsens.  Total duration of non-face-to-face encounter: 30 minutes.  Note by: Gillis Santa, MD Date: 05/17/2019; Time: 10:07 AM

## 2019-06-30 DIAGNOSIS — F5101 Primary insomnia: Secondary | ICD-10-CM | POA: Insufficient documentation

## 2019-07-01 LAB — SPECIMEN STATUS REPORT

## 2019-07-04 LAB — TOXASSURE SELECT 13 (MW), URINE

## 2019-08-11 ENCOUNTER — Encounter: Payer: Self-pay | Admitting: Student in an Organized Health Care Education/Training Program

## 2019-08-15 ENCOUNTER — Ambulatory Visit
Payer: BC Managed Care – PPO | Attending: Student in an Organized Health Care Education/Training Program | Admitting: Student in an Organized Health Care Education/Training Program

## 2019-08-15 ENCOUNTER — Other Ambulatory Visit: Payer: Self-pay

## 2019-08-15 ENCOUNTER — Encounter: Payer: Self-pay | Admitting: Student in an Organized Health Care Education/Training Program

## 2019-08-15 DIAGNOSIS — G894 Chronic pain syndrome: Secondary | ICD-10-CM | POA: Diagnosis not present

## 2019-08-15 DIAGNOSIS — M7918 Myalgia, other site: Secondary | ICD-10-CM

## 2019-08-15 DIAGNOSIS — Z981 Arthrodesis status: Secondary | ICD-10-CM | POA: Diagnosis not present

## 2019-08-15 DIAGNOSIS — Z9889 Other specified postprocedural states: Secondary | ICD-10-CM | POA: Diagnosis not present

## 2019-08-15 DIAGNOSIS — M542 Cervicalgia: Secondary | ICD-10-CM

## 2019-08-15 MED ORDER — OXYCODONE-ACETAMINOPHEN 5-325 MG PO TABS: 1.0000 | ORAL_TABLET | Freq: Every day | ORAL | 0 refills | Status: DC | PRN

## 2019-08-15 NOTE — Progress Notes (Signed)
Patient: Shannon Cortez  Service Category: E/M  Provider: Bilal Lateef, MD  DOB: 04/07/1966  DOS: 08/15/2019  Location: Office  MRN: [id number]  Setting: Ambulatory outpatient  Referring Provider: No ref. provider found  Type: Established Patient  Specialty: Interventional Pain Management  PCP: System, Pcp Not In  Location: Home  Delivery: TeleHealth     Virtual Encounter - Pain Management PROVIDER NOTE: Information contained herein reflects review and annotations entered in association with encounter. Interpretation of such information and data should be left to medically-trained personnel. Information provided to patient can be located elsewhere in the medical record under "Patient Instructions". Document created using STT-dictation technology, any transcriptional errors that may result from process are unintentional.    Contact & Pharmacy Preferred: [phone] Home: [phone] (home) Mobile: There is no such number on file (mobile). E-mail: Shannon Cortez  [id number] - [place], [place] - [address] [place]  [address] Phone: [phone] Fax: [phone]   Pre-screening  Shannon Cortez offered "in-person" vs "virtual" encounter. She indicated preferring virtual for this encounter.   Reason COVID-19*  Social distancing based on CDC and AMA recommendations.   I contacted Shannon Cortez on 08/15/2019 via telephone.      I clearly identified myself as Bilal Lateef, MD. I verified that I was speaking with the correct person using two identifiers (Name: Shannon Cortez, and date of birth: 09/28/1965).  This visit was completed via telephone due to the restrictions of the COVID-19 pandemic. All issues as above were discussed and addressed but no physical exam was performed. If it was felt that the patient should be evaluated in the office, they were directed there. The patient verbally consented to this visit. Patient was unable to complete an  audio/visual visit due to Technical difficulties and/or Lack of internet. Due to the catastrophic nature of the COVID-19 pandemic, this visit was done through audio contact only.  Location of the patient: home address (see Epic for details)  Location of the provider: office  Consent I sought verbal advanced consent from Shannon Cortez for virtual visit interactions. I informed Shannon Cortez of possible security and privacy concerns, risks, and limitations associated with providing "not-in-person" medical evaluation and management services. I also informed Shannon Cortez of the availability of "in-person" appointments. Finally, I informed her that there would be a charge for the virtual visit and that she could be  personally, fully or partially, financially responsible for it. Shannon Cortez expressed understanding and agreed to proceed.   Historic Elements   Shannon Cortez is a 54 y.o. year old, female patient evaluated today after her last contact with our practice on 05/17/2019. Shannon Cortez  has a past medical history of DDD (degenerative disc disease), lumbosacral, Hyperlipidemia, and Hypertension. She also  has a past surgical history that includes Neck surgery; Abdominal hysterectomy; and Nasal reconstruction with septal repair. Shannon Cortez has a current medication list which includes the following prescription(s): aspirin ec, atorvastatin, hydrochlorothiazide, ibuprofen, losartan, NON FORMULARY, oxycodone-acetaminophen, and atenolol. She  reports that she quit smoking about 5 years ago. She has never used smokeless tobacco. She reports that she does not drink alcohol or use drugs. Shannon Cortez is allergic to toradol [ketorolac tromethamine] and sulfa antibiotics.   HPI  Today, she is being contacted for medication management.   No change in medical history since last visit.  Patient's pain is at baseline.  Patient continues multimodal pain regimen as prescribed.  States that it  provides pain relief and improvement   in functional status.  Pharmacotherapy Assessment  Analgesic: 05/17/2019  2   05/17/2019  Oxycodone-Acetaminophen 5-325  60.00  30 Bi Lat   [id number]   Nor (4618)   0  15.00 MME  Comm Ins   [place]   Monitoring: [place] PMP: PDMP reviewed during this encounter.       Pharmacotherapy: No side-effects or adverse reactions reported. Compliance: No problems identified. Effectiveness: Clinically acceptable. Plan: Refer to "POC".  UDS:  Summary  Date Value Ref Range Status  06/30/2019 Note  Final    Comment:    ==================================================================== Tox[employer] Select 13 (MW) ==================================================================== Test                             Result       Flag       Units Drug Present and Declared for Prescription Verification   Oxycodone                      132          EXPECTED   ng/mg creat   Noroxycodone                   311          EXPECTED   ng/mg creat    Sources of oxycodone include scheduled prescription medications.    Noroxycodone is an expected metabolite of oxycodone. ==================================================================== Test                      Result    Flag   Units      Ref Range   Creatinine              71               mg/dL      >=20 ==================================================================== Declared Medications:  The flagging and interpretation on this report are based on the  following declared medications.  Unexpected results may arise from  inaccuracies in the declared medications.  **Note: The testing scope of this panel includes these medications:  Oxycodone (Percocet)  **Note: The testing scope of this panel does not include the  following reported medications:  Acetaminophen (Percocet)  Aspirin  Atenolol (Tenormin)  Atorvastatin (Lipitor)  Hydrochlorothiazide  Ibuprofen (Advil)  Losartan (Cozaar)  Methocarbamol (Robaxin)   Topical ==================================================================== For clinical consultation, please call [phone]. ====================================================================    Laboratory Chemistry Profile   Renal Lab Results  Component Value Date   BUN 13 04/18/2018   CREATININE 0.88 04/18/2018   GFRAA >60 04/18/2018   GFRNONAA >60 04/18/2018     Hepatic Lab Results  Component Value Date   AST 22 04/18/2018   ALT 21 04/18/2018   ALBUMIN 3.6 04/18/2018   ALKPHOS 94 04/18/2018   LIPASE 33 04/18/2018     Electrolytes Lab Results  Component Value Date   NA 139 04/18/2018   K 3.6 04/18/2018   CL 106 04/18/2018   CALCIUM 8.7 (L) 04/18/2018     Bone No results found for: VD25OH, ZD664QI3KVQ, QV9563OV5, IE3329JJ8, 25OHVITD1, 25OHVITD2, 25OHVITD3, TESTOFREE, TESTOSTERONE   Inflammation (CRP: Acute Phase) (ESR: Chronic Phase) Lab Results  Component Value Date   LATICACIDVEN 1.13 04/16/2018       Note: Above Lab results reviewed.  Imaging  DG Wrist Complete Left CLINICAL DATA:  Left posterior wrist pain laceration following a fall today.  EXAM: LEFT WRIST - COMPLETE 3+ VIEW  COMPARISON:  None.  FINDINGS: Shallow linear soft tissue defect in the dorsal aspect of the wrist. Two small infused ossicles distal to the ulnar styloid. No acute fracture or dislocation.  IMPRESSION: No acute fracture or dislocation.  Electronically Signed   By: Claudie Revering M.D.   On: 04/12/2018 21:36  Assessment  The primary encounter diagnosis was Hx of fusion of cervical spine. Diagnoses of Chronic pain syndrome, H/O cervical spine surgery (ACDF C5-C7 in [place]), Myofascial pain syndrome, and Neck pain were also pertinent to this visit.  Plan of Care   Ms. Kayleigh Broadwell has a current medication list which includes the following long-term medication(s): atorvastatin, hydrochlorothiazide, losartan, and atenolol.  Pharmacotherapy  (Medications Ordered): Meds ordered this encounter  Medications  . oxyCODONE-acetaminophen (PERCOCET/ROXICET) 5-325 MG tablet    Sig: Take 1-2 tablets by mouth daily as needed for moderate pain. For chronic pain syndrome    Dispense:  60 tablet    Refill:  0    Follow-up plan:   Return in about 3 months (around 11/14/2019) for Medication Management, virtual.    Recent Visits Date Type Provider Dept  05/17/19 Office Visit Gillis Santa, MD [facility]  Showing recent visits within past 90 days and meeting all other requirements   Today's Visits Date Type Provider Dept  08/15/19 Office Visit Gillis Santa, MD [facility]  Showing today's visits and meeting all other requirements   Future Appointments No visits were found meeting these conditions.  Showing future appointments within next 90 days and meeting all other requirements   I discussed the assessment and treatment plan with the patient. The patient was provided an opportunity to ask questions and all were answered. The patient agreed with the plan and demonstrated an understanding of the instructions.  Patient advised to call back or seek an in-person evaluation if the symptoms or condition worsens.  Duration of encounter: 21mnutes.  Note by: BGillis Santa MD Date: 08/15/2019; Time: 2:30 PM

## 2019-11-10 ENCOUNTER — Encounter: Payer: Self-pay | Admitting: Student in an Organized Health Care Education/Training Program

## 2019-11-14 ENCOUNTER — Encounter: Payer: Self-pay | Admitting: Student in an Organized Health Care Education/Training Program

## 2019-11-14 ENCOUNTER — Other Ambulatory Visit: Payer: Self-pay

## 2019-11-14 ENCOUNTER — Ambulatory Visit
Payer: BC Managed Care – PPO | Attending: Student in an Organized Health Care Education/Training Program | Admitting: Student in an Organized Health Care Education/Training Program

## 2019-11-14 DIAGNOSIS — G894 Chronic pain syndrome: Secondary | ICD-10-CM

## 2019-11-14 DIAGNOSIS — Z9889 Other specified postprocedural states: Secondary | ICD-10-CM

## 2019-11-14 DIAGNOSIS — M7918 Myalgia, other site: Secondary | ICD-10-CM

## 2019-11-14 DIAGNOSIS — M542 Cervicalgia: Secondary | ICD-10-CM

## 2019-11-14 DIAGNOSIS — Z981 Arthrodesis status: Secondary | ICD-10-CM

## 2019-11-14 MED ORDER — OXYCODONE-ACETAMINOPHEN 5-325 MG PO TABS: 1.0000 | ORAL_TABLET | Freq: Every day | ORAL | 0 refills | Status: DC | PRN

## 2019-11-14 NOTE — Progress Notes (Signed)
Patient: Shannon Cortez  Service Category: E/M  Provider: Gillis Santa, MD  DOB: 27-Oct-1965  DOS: 11/14/2019  Location: Office  MRN: [id number]  Setting: Ambulatory outpatient  Referring Provider: No ref. provider found  Type: Established Patient  Specialty: Interventional Pain Management  PCP: System, Pcp Not In  Location: Home  Delivery: TeleHealth     Virtual Encounter - Pain Management PROVIDER NOTE: Information contained herein reflects review and annotations entered in association with encounter. Interpretation of such information and data should be left to medically-trained personnel. Information provided to patient can be located elsewhere in the medical record under "Patient Instructions". Document created using STT-dictation technology, any transcriptional errors that may result from process are unintentional.    Contact & Pharmacy Preferred: [phone] Home: [phone] (home) Mobile: There is no such number on file (mobile). E-mail: [email or web address]  [id number] [address]: [phone]: [phone]  Pre-screening  [name] offered "in-person" vs "virtual" encounter. She indicated preferring virtual for this encounter.   Reason COVID-19*   Social distancing based on CDC and AMA recommendations.   I contacted DAyonna Speranzaon 11/14/2019 via telephone.      I clearly identified myself as BGillis Santa MD. I verified that I was speaking with the correct person using two identifiers (Name: [name] and date of birth: [birth year].  Consent I sought verbal advanced consent from DSoyla Murphyfor virtual visit interactions. I informed Ms. Botero of possible security and privacy concerns, risks, and limitations associated with providing "not-in-person" medical evaluation and management services. I also informed Ms. Everding of the availability of "in-person" appointments.  Finally, I informed her that there would be a charge for the virtual visit and that she could be  personally, fully or partially, financially responsible for it. Ms. GMelderexpressed understanding and agreed to proceed.   Historic Elements   Ms. Shannon Vanderploegis a 54y.o. year old, female patient evaluated today after her last contact with our practice on 08/15/2019. Ms. GPinkham has a past medical history of DDD (degenerative disc disease), lumbosacral, Hyperlipidemia, and Hypertension. She also  has a past surgical history that includes Neck surgery; Abdominal hysterectomy; and Nasal reconstruction with septal repair. Ms. GEtheringtonhas a current medication list which includes the following prescription(s): aspirin ec, atenolol, atorvastatin, hydrochlorothiazide, ibuprofen, losartan, NON FORMULARY, and oxycodone-acetaminophen. She  reports that she quit smoking about 5 years ago. She has never used smokeless tobacco. She reports that she does not drink alcohol and does not use drugs. Ms. GScalesis allergic to toradol [ketorolac tromethamine] and sulfa antibiotics.   HPI  Today, she is being contacted for medication management.   Increased pain due to fall caused by cat. No bruising. Likely lumbar sprain due to fall. Is utilizing her oxycodone as prescribed, adherent with medication regimen.  We will refill as below.  UDS up-to-date and appropriate. Next visit will be face-to-face.  Pharmacotherapy Assessment  Analgesic:  08/15/2019  2   08/15/2019  Oxycodone-Acetaminophen 5-325  60.00  30 Bi Lat   [id number]  Nor (4618)   0/0  15.00 MME  Comm Ins   [place]   Monitoring:  PMP: PDMP reviewed during this encounter.       Pharmacotherapy: No side-effects or adverse reactions reported. Compliance: No problems identified. Effectiveness: Clinically acceptable. Plan: Refer to "POC".  UDS:  Summary  Date Value Ref Range Status  06/30/2019 Note  Final  Comment:     ==================================================================== [employer]ox[employer]SU[employer] Select 13 (MW) ==================================================================== Test                             Result       Flag       Units Drug Present and Declared for Prescription Verification   Oxycodone                      132          EXPECTED   ng/mg creat   Noroxycodone                   311          EXPECTED   ng/mg creat    Sources of oxycodone include scheduled prescription medications.    Noroxycodone is an expected metabolite of oxycodone. ==================================================================== Test                      Result    Flag   Units      Ref Range   Creatinine              71               mg/dL      >=20 ==================================================================== Declared Medications:  The flagging and interpretation on this report are based on the  following declared medications.  Unexpected results may arise from  inaccuracies in the declared medications.  **Note: The testing scope of this panel includes these medications:  Oxycodone (Percocet)  **Note: The testing scope of this panel does not include the  following reported medications:  Acetaminophen (Percocet)  Aspirin  Atenolol (Tenormin)  Atorvastatin (Lipitor)  Hydrochlorothiazide  Ibuprofen (Advil)  Losartan (Cozaar)  Methocarbamol (Robaxin)  Topical ==================================================================== For clinical consultation, please call [phone]. ====================================================================     Laboratory Chemistry Profile   Renal Lab Results  Component Value Date   BUN 13 04/18/2018   CREATININE 0.88 04/18/2018   GFRAA >60 04/18/2018   GFRNONAA >60 04/18/2018     Hepatic Lab Results  Component Value Date   AST 22 04/18/2018   ALT 21 04/18/2018   ALBUMIN 3.6 04/18/2018   ALKPHOS 94 04/18/2018   LIPASE 33 04/18/2018      Electrolytes Lab Results  Component Value Date   NA 139 04/18/2018   K 3.6 04/18/2018   CL 106 04/18/2018   CALCIUM 8.7 (L) 04/18/2018     Bone No results found for: VD25OH, [id number], SF6812XN1, ZG0174BS4, 25OHVITD1, 25OHVITD2, 25OHVITD3, TESTOFREE, TESTOSTERONE   Inflammation (CRP: Acute Phase) (ESR: Chronic Phase) Lab Results  Component Value Date   LATICACIDVEN 1.13 04/16/2018       Note: Above Lab results reviewed.   Imaging  DG Wrist Complete Left CLINICAL DATA:  Left posterior wrist pain laceration following a fall today.  EXAM: LEFT WRIST - COMPLETE 3+ VIEW  COMPARISON:  None.  FINDINGS: Shallow linear soft tissue defect in the dorsal aspect of the wrist. Two small infused ossicles distal to the ulnar styloid. No acute fracture or dislocation.  IMPRESSION: No acute fracture or dislocation.  Electronically Signed   By: Claudie Revering M.D.   On: 04/12/2018 21:36  Assessment  The primary encounter diagnosis was Chronic pain syndrome. Diagnoses of Hx of fusion of cervical spine, H/O cervical spine surgery (ACDF C5-C7 in [place]), Myofascial pain syndrome, and Neck pain were also pertinent  to this visit.  Plan of Care  Ms. Trenia Tennyson has a current medication list which includes the following long-term medication(s): atenolol, atorvastatin, hydrochlorothiazide, and losartan.  Pharmacotherapy (Medications Ordered): Meds ordered this encounter  Medications   oxyCODONE-acetaminophen (PERCOCET/ROXICET) 5-325 MG tablet    Sig: Take 1-2 tablets by mouth daily as needed for moderate pain. For chronic pain syndrome    Dispense:  60 tablet    Refill:  0    Follow-up plan:   Return in about 3 months (around 02/14/2020) for Medication Management, in person.    Recent Visits No visits were found meeting these conditions. Showing recent visits within past 90 days and meeting all other requirements Today's Visits Date Type Provider Dept  11/14/19  Telemedicine Gillis Santa, MD [facility]  Showing today's visits and meeting all other requirements Future Appointments No visits were found meeting these conditions. Showing future appointments within next 90 days and meeting all other requirements  I discussed the assessment and treatment plan with the patient. The patient was provided an opportunity to ask questions and all were answered. The patient agreed with the plan and demonstrated an understanding of the instructions.  Patient advised to call back or seek an in-person evaluation if the symptoms or condition worsens.  Duration of encounter: 83mnutes.  Note by: BGillis Santa MD Date: 11/14/2019; Time: 2:38 PM

## 2020-02-14 ENCOUNTER — Other Ambulatory Visit: Payer: Self-pay

## 2020-02-14 ENCOUNTER — Ambulatory Visit
Payer: BC Managed Care – PPO | Attending: Student in an Organized Health Care Education/Training Program | Admitting: Student in an Organized Health Care Education/Training Program

## 2020-02-14 ENCOUNTER — Encounter: Payer: Self-pay | Admitting: Student in an Organized Health Care Education/Training Program

## 2020-02-14 VITALS — BP 154/91 | HR 89 | Temp 97.1°F | Resp 16 | Ht 62.0 in | Wt 179.0 lb

## 2020-02-14 DIAGNOSIS — G894 Chronic pain syndrome: Secondary | ICD-10-CM

## 2020-02-14 DIAGNOSIS — Z981 Arthrodesis status: Secondary | ICD-10-CM | POA: Diagnosis present

## 2020-02-14 DIAGNOSIS — M542 Cervicalgia: Secondary | ICD-10-CM | POA: Insufficient documentation

## 2020-02-14 DIAGNOSIS — Z9889 Other specified postprocedural states: Secondary | ICD-10-CM

## 2020-02-14 DIAGNOSIS — M7918 Myalgia, other site: Secondary | ICD-10-CM | POA: Insufficient documentation

## 2020-02-14 MED ORDER — OXYCODONE-ACETAMINOPHEN 5-325 MG PO TABS: 1.0000 | ORAL_TABLET | Freq: Every day | ORAL | 0 refills | Status: AC | PRN

## 2020-02-14 MED ORDER — IBUPROFEN 800 MG PO TABS: 800.0000 mg | ORAL_TABLET | Freq: Three times a day (TID) | ORAL | 2 refills | Status: DC | PRN

## 2020-02-14 NOTE — Assessment & Plan Note (Addendum)
Pain contract signed with Dr. Cherylann Ratel.  Utilizes Percocet 5 mg daily as needed.  Quantity 60 last 3 months.  Also utilizes Robaxin 500 mg twice daily and ibuprofen 800 mg twice daily to 3 times daily as needed.  Requested Prescriptions   Signed Prescriptions Disp Refills  . oxyCODONE-acetaminophen (PERCOCET/ROXICET) 5-325 MG tablet 60 tablet 0    Sig: Take 1-2 tablets by mouth daily as needed for moderate pain. For chronic pain syndrome  . ibuprofen (ADVIL) 800 MG tablet 60 tablet 2    Sig: Take 1 tablet (800 mg total) by mouth every 8 (eight) hours as needed for moderate pain.

## 2020-02-14 NOTE — Progress Notes (Signed)
PROVIDER NOTE: Information contained herein reflects review and annotations entered in association with encounter. Interpretation of such information and data should be left to medically-trained personnel. Information provided to patient can be located elsewhere in the medical record under "Patient Instructions". Document created using STT-dictation technology, any transcriptional errors that may result from process are unintentional.    Patient: Shannon Cortez  Service Category: E/M  Provider: Gillis Santa, MD  DOB: [birth year]  DOS: 02/14/2020  Specialty: Interventional Pain Management  MRN: [id number]  Setting: Ambulatory outpatient  PCP: Pcp, No  Type: Established Patient    Referring Provider: No ref. provider found  Location: Office  Delivery: Face-to-face     HPI  [name], a 54 y.o. year old female, is here today because of her H/O cervical spine surgery [Z98.890]. [name] primary complain today is Neck Pain and Back Pain (lower) Last encounter: My last encounter with her was on Visit date not found. Pertinent problems: [name] has Cellulitis; Chronic pain syndrome; Hx of fusion of cervical spine; Neck pain; and Myofascial pain syndrome on their pertinent problem list. Pain Assessment: Severity of Chronic pain is reported as a 5 /10. Location: Neck Left/to left shoulder. Onset: More than a month ago. Quality: Sharp, Burning. Timing: Constant. Modifying factor(s): meds. Vitals:  height is _0  (1.575 m) and weight is 179 lb (81.2 kg). Her temporal temperature is 97.1 F (36.2 C) (abnormal). Her blood pressure is 154/91 (abnormal) and her pulse is 89. Her respiration is 16 and oxygen saturation is 98%.   Reason for encounter: medication management.   No change in medical history since last visit.  Patient's pain is at baseline.  Patient continues multimodal pain regimen as prescribed.  States that it provides pain relief and improvement in functional  status.   Pharmacotherapy Assessment   Analgesic: Percocet 5 mg daily as needed, quantity 60 last 3 months    Monitoring: Shannon Cortez PMP: PDMP reviewed during this encounter.       Pharmacotherapy: No side-effects or adverse reactions reported. Compliance: No problems identified. Effectiveness: Clinically acceptable.  Rise Patience, RN  02/14/2020  8:07 AM  Sign when Signing Visit Nursing Pain Medication Assessment:  Safety precautions to be maintained throughout the outpatient stay will include: orient to surroundings, keep bed in low position, maintain call bell within reach at all times, provide assistance with transfer out of bed and ambulation.  Medication Inspection Compliance: Pill count conducted under aseptic conditions, in front of the patient. Neither the pills nor the bottle was removed from the patient's sight at any time. Once count was completed pills were immediately returned to the patient in their original bottle.  Medication: Oxycodone/APAP Pill/Patch Count: 9.75 of 60 pills remain Pill/Patch Appearance: Markings consistent with prescribed medication Bottle Appearance: Standard pharmacy container. Clearly labeled. Filled Date: 07 / 12 / 2021 Last Medication intake:  Yesterday    UDS:  Summary  Date Value Ref Range Status  06/30/2019 Note  Final    Comment:    ==================================================================== [employer] (MW) ==================================================================== Test                             Result       Flag       Units Drug Present and Declared for Prescription Verification   Oxycodone                      132  EXPECTED   ng/mg creat   Noroxycodone                   311          EXPECTED   ng/mg creat    Sources of oxycodone include scheduled prescription medications.    Noroxycodone is an expected metabolite of oxycodone. ==================================================================== Test                       Result    Flag   Units      Ref Range   Creatinine              71               mg/dL      >=20 ==================================================================== Declared Medications:  The flagging and interpretation on this report are based on the  following declared medications.  Unexpected results may arise from  inaccuracies in the declared medications.  **Note: The testing scope of this panel includes these medications:  Oxycodone (Percocet)  **Note: The testing scope of this panel does not include the  following reported medications:  Acetaminophen (Percocet)  Aspirin  Atenolol (Tenormin)  Atorvastatin (Lipitor)  Hydrochlorothiazide  Ibuprofen (Advil)  Losartan (Cozaar)  Methocarbamol (Robaxin)  Topical ==================================================================== For clinical consultation, please call [phone]. ====================================================================      ROS  Constitutional: Denies any fever or chills Gastrointestinal: No reported hemesis, hematochezia, vomiting, or acute GI distress Musculoskeletal: Neck pain Neurological: No reported episodes of acute onset apraxia, aphasia, dysarthria, agnosia, amnesia, paralysis, loss of coordination, or loss of consciousness  Medication Review  NON FORMULARY, aspirin EC, atenolol, atorvastatin, ibuprofen, losartan, and oxyCODONE-acetaminophen  History Review  Allergy: [name] is allergic to toradol [ketorolac tromethamine] and sulfa antibiotics. Drug: [name]  reports no history of drug use. Alcohol:  reports no history of alcohol use. Tobacco:  reports that she quit smoking about 5 years ago. She has never used smokeless tobacco. Social: [name]  reports that she quit smoking about 5 years ago. She has never used smokeless tobacco. She reports that she does not drink alcohol and does not use drugs. Medical:  has a past medical history of DDD  (degenerative disc disease), lumbosacral, Hyperlipidemia, and Hypertension. Surgical: [name]  has a past surgical history that includes Neck surgery; Abdominal hysterectomy; and Nasal reconstruction with septal repair. Family: family history includes Hepatitis in her mother.  Laboratory Chemistry Profile   Renal Lab Results  Component Value Date   BUN 13 04/18/2018   CREATININE 0.88 04/18/2018   GFRAA >60 04/18/2018   GFRNONAA >60 04/18/2018     Hepatic Lab Results  Component Value Date   AST 22 04/18/2018   ALT 21 04/18/2018   ALBUMIN 3.6 04/18/2018   ALKPHOS 94 04/18/2018   LIPASE 33 04/18/2018     Electrolytes Lab Results  Component Value Date   NA 139 04/18/2018   K 3.6 04/18/2018   CL 106 04/18/2018   CALCIUM 8.7 (L) 04/18/2018     Bone No results found for: VD25OH, VX480XK5VVZ, SM2707EM7, JQ4920FE0, 25OHVITD1, 25OHVITD2, 25OHVITD3, TESTOFREE, TESTOSTERONE   Inflammation (CRP: Acute Phase) (ESR: Chronic Phase) Lab Results  Component Value Date   LATICACIDVEN 1.13 04/16/2018       Note: Above Lab results reviewed.  Recent Imaging Review  DG Wrist Complete Left CLINICAL DATA:  Left posterior wrist pain laceration following a fall today.  EXAM: LEFT WRIST - COMPLETE 3+ VIEW  COMPARISON:  None.  FINDINGS: Shallow linear soft tissue defect in the dorsal aspect of the wrist. Two small infused ossicles distal to the ulnar styloid. No acute fracture or dislocation.  IMPRESSION: No acute fracture or dislocation.  Electronically Signed   By: Claudie Revering M.D.   On: 04/12/2018 21:36 Note: Reviewed        Physical Exam  General appearance: Well nourished, well developed, and well hydrated. In no apparent acute distress Mental status: Alert, oriented x 3 (person, place, & time)       Respiratory: No evidence of acute respiratory distress Eyes: PERLA Vitals: BP (!) 154/91   Pulse 89   Temp (!) 97.1 F (36.2 C) (Temporal)   Resp 16   Ht _0   (1.575 m)   Wt 179 lb (81.2 kg)   SpO2 98%   BMI 32.74 kg/m  BMI: Estimated body mass index is 32.74 kg/m as calculated from the following:   Height as of this encounter: _1  (1.575 m).   Weight as of this encounter: 179 lb (81.2 kg). Ideal: Ideal body weight: 50.1 kg (110 lb 7.2 oz) Adjusted ideal body weight: 62.5 kg (137 lb 13.9 oz)  Cervical Spine Area Exam  Skin & Axial Inspection: No masses, redness, edema, swelling, or associated skin lesions Alignment: Symmetrical Functional ROM: Decreased ROM, bilaterally Stability: No instability detected Muscle Tone/Strength: Functionally intact. No obvious neuro-muscular anomalies detected. Sensory (Neurological): Musculoskeletal pain pattern Palpation: Complains of area being tender to palpation Positive provocative maneuver for for cervical facet disease        Upper Extremity (UE) Exam    Side: Right upper extremity  Side: Left upper extremity   Skin & Extremity Inspection: Skin color, temperature, and hair growth are WNL. No peripheral edema or cyanosis. No masses, redness, swelling, asymmetry, or associated skin lesions. No contractures.  Skin & Extremity Inspection: Skin color, temperature, and hair growth are WNL. No peripheral edema or cyanosis. No masses, redness, swelling, asymmetry, or associated skin lesions. No contractures.   Functional ROM: Unrestricted ROM          Functional ROM: Unrestricted ROM           Muscle Tone/Strength: Functionally intact. No obvious neuro-muscular anomalies detected.  Muscle Tone/Strength: Functionally intact. No obvious neuro-muscular anomalies detected.   Sensory (Neurological): Unimpaired          Sensory (Neurological): Unimpaired           Palpation: No palpable anomalies              Palpation: No palpable anomalies               Provocative Test(s):  Phalen's test: deferred Tinel's test: deferred Apley's scratch test (touch opposite shoulder):  Action 1 (Across chest):  deferred Action 2 (Overhead): deferred Action 3 (LB reach): deferred   Provocative Test(s):  Phalen's test: deferred Tinel's test: deferred Apley's scratch test (touch opposite shoulder):  Action 1 (Across chest): deferred Action 2 (Overhead): deferred Action 3 (LB reach): deferred       Assessment   Status Diagnosis  Controlled Controlled Controlled 1. H/O cervical spine surgery (ACDF C5-C7 in [place])   2. Chronic pain syndrome   3. Hx of fusion of cervical spine   4. Myofascial pain syndrome   5. Neck pain      Updated Problems: Problem  Myofascial Pain Syndrome  Chronic Pain Syndrome  Hx of Fusion of Cervical Spine  Neck Pain  Cellulitis  Plan of Care  Problem-specific:  Hx of fusion of cervical spine History of C5-C7 ACDF.  Chronic pain syndrome Pain contract signed with Dr. Holley Raring.  Utilizes Percocet 5 mg daily as needed.  Quantity 60 last 3 months.  Also utilizes Robaxin 500 mg twice daily and ibuprofen 800 mg twice daily to 3 times daily as needed.  Requested Prescriptions   Signed Prescriptions Disp Refills  . oxyCODONE-acetaminophen (PERCOCET/ROXICET) 5-325 MG tablet 60 tablet 0    Sig: Take 1-2 tablets by mouth daily as needed for moderate pain. For chronic pain syndrome  . ibuprofen (ADVIL) 800 MG tablet 60 tablet 2    Sig: Take 1 tablet (800 mg total) by mouth every 8 (eight) hours as needed for moderate pain.     Ms. Juanice Warburton has a current medication list which includes the following long-term medication(s): atenolol, atorvastatin, and losartan.  Pharmacotherapy (Medications Ordered): Meds ordered this encounter  Medications  . oxyCODONE-acetaminophen (PERCOCET/ROXICET) 5-325 MG tablet    Sig: Take 1-2 tablets by mouth daily as needed for moderate pain. For chronic pain syndrome    Dispense:  60 tablet    Refill:  0  . ibuprofen (ADVIL) 800 MG tablet    Sig: Take 1 tablet (800 mg total) by mouth every 8 (eight) hours as  needed for moderate pain.    Dispense:  60 tablet    Refill:  2   Follow-up plan:   Return in about 3 months (around 05/16/2020) for Medication Management, in person.   Recent Visits No visits were found meeting these conditions. Showing recent visits within past 90 days and meeting all other requirements Today's Visits Date Type Provider Dept  02/14/20 Office Visit Gillis Santa, MD [facility]  Showing today's visits and meeting all other requirements Future Appointments No visits were found meeting these conditions. Showing future appointments within next 90 days and meeting all other requirements  I discussed the assessment and treatment plan with the patient. The patient was provided an opportunity to ask questions and all were answered. The patient agreed with the plan and demonstrated an understanding of the instructions.  Patient advised to call back or seek an in-person evaluation if the symptoms or condition worsens.  Duration of encounter: 86mnutes.  Note by: BGillis Santa MD Date: 02/14/2020; Time: 8:42 AM

## 2020-02-14 NOTE — Assessment & Plan Note (Signed)
History of C5-C7 ACDF.

## 2020-02-14 NOTE — Progress Notes (Signed)
Nursing Pain Medication Assessment:  Safety precautions to be maintained throughout the outpatient stay will include: orient to surroundings, keep bed in low position, maintain call bell within reach at all times, provide assistance with transfer out of bed and ambulation.  Medication Inspection Compliance: Pill count conducted under aseptic conditions, in front of the patient. Neither the pills nor the bottle was removed from the patient's sight at any time. Once count was completed pills were immediately returned to the patient in their original bottle.  Medication: Oxycodone/APAP Pill/Patch Count: 9.75 of 60 pills remain Pill/Patch Appearance: Markings consistent with prescribed medication Bottle Appearance: Standard pharmacy container. Clearly labeled. Filled Date: 07 / 12 / 2021 Last Medication intake:  Yesterday

## 2020-02-14 NOTE — Patient Instructions (Addendum)
Oxycodone to last untill 11/11/21and ibuprofen 800 has been escribed to your pharmacy.

## 2020-03-11 ENCOUNTER — Other Ambulatory Visit: Payer: Self-pay | Admitting: Student in an Organized Health Care Education/Training Program

## 2020-03-12 ENCOUNTER — Telehealth: Payer: Self-pay

## 2020-03-12 DIAGNOSIS — G894 Chronic pain syndrome: Secondary | ICD-10-CM

## 2020-03-12 MED ORDER — METHOCARBAMOL 500 MG PO TABS: 500.0000 mg | ORAL_TABLET | Freq: Three times a day (TID) | ORAL | 5 refills | Status: DC | PRN

## 2020-03-12 NOTE — Telephone Encounter (Signed)
The pharmacy wont refill her script because the script expired 02/15/20. Can you call out another script? Last filled on 02/07/20.

## 2020-03-12 NOTE — Telephone Encounter (Signed)
Dr Cherylann Ratel, the patient has 2 refills on her Robaxin but it has expired.  Can you resend please.  Thank you

## 2020-03-12 NOTE — Telephone Encounter (Signed)
Script sent in by Dr Cherylann Ratel.  Patient notified.

## 2020-05-15 ENCOUNTER — Ambulatory Visit
Payer: BC Managed Care – PPO | Attending: Student in an Organized Health Care Education/Training Program | Admitting: Student in an Organized Health Care Education/Training Program

## 2020-05-15 ENCOUNTER — Encounter: Payer: Self-pay | Admitting: Student in an Organized Health Care Education/Training Program

## 2020-05-15 ENCOUNTER — Other Ambulatory Visit: Payer: Self-pay

## 2020-05-15 VITALS — BP 153/74 | HR 93 | Temp 97.0°F | Resp 16 | Ht 62.0 in | Wt 180.0 lb

## 2020-05-15 DIAGNOSIS — M7918 Myalgia, other site: Secondary | ICD-10-CM | POA: Diagnosis present

## 2020-05-15 DIAGNOSIS — Z981 Arthrodesis status: Secondary | ICD-10-CM | POA: Diagnosis present

## 2020-05-15 DIAGNOSIS — Z9889 Other specified postprocedural states: Secondary | ICD-10-CM | POA: Insufficient documentation

## 2020-05-15 DIAGNOSIS — G894 Chronic pain syndrome: Secondary | ICD-10-CM

## 2020-05-15 DIAGNOSIS — M542 Cervicalgia: Secondary | ICD-10-CM | POA: Diagnosis present

## 2020-05-15 MED ORDER — OXYCODONE-ACETAMINOPHEN 5-325 MG PO TABS: 1.0000 | ORAL_TABLET | Freq: Every day | ORAL | 0 refills | Status: AC | PRN

## 2020-05-15 NOTE — Progress Notes (Signed)
PROVIDER NOTE: Information contained herein reflects review and annotations entered in association with encounter. Interpretation of such information and data should be left to medically-trained personnel. Information provided to patient can be located elsewhere in the medical record under "Patient Instructions". Document created using STT-dictation technology, any transcriptional errors that may result from process are unintentional.    Patient: Shannon Cortez  Service Category: E/M  Provider: Gillis Santa, MD  DOB: [birth year]  DOS: 05/15/2020  Specialty: Interventional Pain Management  MRN: [id number]  Setting: Ambulatory outpatient  PCP: Pcp, No  Type: Established Patient    Referring Provider: No ref. provider found  Location: Office  Delivery: Face-to-face     HPI  [name], a 55 y.o. year old female, is here today because of her Chronic pain syndrome [G89.4]. [name] primary complain today is Neck Pain and Back Pain (lower) Last encounter: My last encounter with her was on 03/11/2020. Pertinent problems: [name] has Cellulitis; Chronic pain syndrome; Hx of fusion of cervical spine; Neck pain; and Myofascial pain syndrome on their pertinent problem list. Pain Assessment: Severity of Chronic pain is reported as a 5 /10. Location: Neck  /neck pain radiates into left shoulder. Onset: More than a month ago. Quality: Sharp. Timing: Constant. Modifying factor(s): rest, stillness, medications. Vitals:  height is $RemoveB'5\' 2"'orgvFTle$  (1.575 m) and weight is 180 lb (81.6 kg). Her temporal temperature is 97 F (36.1 C) (abnormal). Her blood pressure is 153/74 (abnormal) and her pulse is 93. Her respiration is 16 and oxygen saturation is 100%.   Reason for encounter: medication management.   Had tooth extraction in early December (6 teeth removed), has recovered from that. Otherwise No change in medical history since last visit.  Patient's pain is at baseline.  Patient continues multimodal pain  regimen as prescribed.  States that it provides pain relief and improvement in functional status.   Pharmacotherapy Assessment   Analgesic: Percocet 5 mg daily as needed, quantity 60 last 3 months    Monitoring: [place] PMP: PDMP reviewed during this encounter.       Pharmacotherapy: No side-effects or adverse reactions reported. Compliance: No problems identified. Effectiveness: Clinically acceptable.  Landis Martins, RN  05/15/2020  8:34 AM  Sign when Signing Visit Nursing Pain Medication Assessment:  Safety precautions to be maintained throughout the outpatient stay will include: orient to surroundings, keep bed in low position, maintain call bell within reach at all times, provide assistance with transfer out of bed and ambulation.  Medication Inspection Compliance: Pill count conducted under aseptic conditions, in front of the patient. Neither the pills nor the bottle was removed from the patient's sight at any time. Once count was completed pills were immediately returned to the patient in their original bottle.  Medication: Oxycodone/APAP Pill/Patch Count: 10 of 60 pills remain Pill/Patch Appearance: Markings consistent with prescribed medication Bottle Appearance: Standard pharmacy container. Clearly labeled. Filled Date: 02/14/2020 Last Medication intake:  Yesterday    UDS:  Summary  Date Value Ref Range Status  06/30/2019 Note  Final    Comment:    ==================================================================== ToxASSURE Select 13 (MW) ==================================================================== Test                             Result       Flag       Units Drug Present and Declared for Prescription Verification   Oxycodone  132          EXPECTED   ng/mg creat   Noroxycodone                   311          EXPECTED   ng/mg creat    Sources of oxycodone include scheduled prescription medications.    Noroxycodone is an expected metabolite of  oxycodone. ==================================================================== Test                      Result    Flag   Units      Ref Range   Creatinine              71               mg/dL      >=20 ==================================================================== Declared Medications:  The flagging and interpretation on this report are based on the  following declared medications.  Unexpected results may arise from  inaccuracies in the declared medications.  **Note: The testing scope of this panel includes these medications:  Oxycodone (Percocet)  **Note: The testing scope of this panel does not include the  following reported medications:  Acetaminophen (Percocet)  Aspirin  Atenolol (Tenormin)  Atorvastatin (Lipitor)  Hydrochlorothiazide  Ibuprofen (Advil)  Losartan (Cozaar)  Methocarbamol (Robaxin)  Topical ==================================================================== For clinical consultation, please call [phone]. ====================================================================      ROS  Constitutional: Denies any fever or chills Gastrointestinal: No reported hemesis, hematochezia, vomiting, or acute GI distress Musculoskeletal: + LBP Neurological: No reported episodes of acute onset apraxia, aphasia, dysarthria, agnosia, amnesia, paralysis, loss of coordination, or loss of consciousness  Medication Review  NON FORMULARY, aspirin EC, atorvastatin, ibuprofen, losartan, methocarbamol, and oxyCODONE-acetaminophen  History Review  Allergy: [name] is allergic to toradol [ketorolac tromethamine] and sulfa antibiotics. Drug: [name]  reports no history of drug use. Alcohol:  reports no history of alcohol use. Tobacco:  reports that she quit smoking about 6 years ago. She has never used smokeless tobacco. Social: [name]  reports that she quit smoking about 6 years ago. She has never used smokeless tobacco. She reports that she does  not drink alcohol and does not use drugs. Medical:  has a past medical history of DDD (degenerative disc disease), lumbosacral, Hyperlipidemia, and Hypertension. Surgical: [name]  has a past surgical history that includes Neck surgery; Abdominal hysterectomy; and Nasal reconstruction with septal repair. Family: family history includes Hepatitis in her mother.  Laboratory Chemistry Profile   Renal Lab Results  Component Value Date   BUN 13 04/18/2018   CREATININE 0.88 04/18/2018   GFRAA >60 04/18/2018   GFRNONAA >60 04/18/2018     Hepatic Lab Results  Component Value Date   AST 22 04/18/2018   ALT 21 04/18/2018   ALBUMIN 3.6 04/18/2018   ALKPHOS 94 04/18/2018   LIPASE 33 04/18/2018     Electrolytes Lab Results  Component Value Date   NA 139 04/18/2018   K 3.6 04/18/2018   CL 106 04/18/2018   CALCIUM 8.7 (L) 04/18/2018     Bone No results found for: VD25OH, KW409BD5HGD, JM4268TM1, DQ2229NL8, 25OHVITD1, 25OHVITD2, 25OHVITD3, TESTOFREE, TESTOSTERONE   Inflammation (CRP: Acute Phase) (ESR: Chronic Phase) Lab Results  Component Value Date   LATICACIDVEN 1.13 04/16/2018       Note: Above Lab results reviewed.  Recent Imaging Review  DG Wrist Complete Left CLINICAL DATA:  Left posterior wrist pain laceration following a  fall today.  EXAM: LEFT WRIST - COMPLETE 3+ VIEW  COMPARISON:  None.  FINDINGS: Shallow linear soft tissue defect in the dorsal aspect of the wrist. Two small infused ossicles distal to the ulnar styloid. No acute fracture or dislocation.  IMPRESSION: No acute fracture or dislocation.  Electronically Signed   By: Claudie Revering M.D.   On: 04/12/2018 21:36 Note: Reviewed        Physical Exam  General appearance: Well nourished, well developed, and well hydrated. In no apparent acute distress Mental status: Alert, oriented x 3 (person, place, & time)       Respiratory: No evidence of acute respiratory distress Eyes: PERLA Vitals: BP  (!) 153/74   Pulse 93   Temp (!) 97 F (36.1 C) (Temporal)   Resp 16   Ht $R'5\' 2"'GX$  (1.575 m)   Wt 180 lb (81.6 kg)   SpO2 100%   BMI 32.92 kg/m  BMI: Estimated body mass index is 32.92 kg/m as calculated from the following:   Height as of this encounter: $RemoveBeforeD'5\' 2"'uOGKCkksyoTXMw$  (1.575 m).   Weight as of this encounter: 180 lb (81.6 kg). Ideal: Ideal body weight: 50.1 kg (110 lb 7.2 oz) Adjusted ideal body weight: 62.7 kg (138 lb 4.3 oz)   +LBP, + pain with facet loading + hip pain  Assessment   Status Diagnosis  Controlled Controlled Controlled 1. Chronic pain syndrome   2. Hx of fusion of cervical spine   3. H/O cervical spine surgery (ACDF C5-C7 in [place] 2016)   4. Myofascial pain syndrome   5. Neck pain        Plan of Care   Ms. Anniah Shannon Cortez has a current medication list which includes the following long-term medication(s): atorvastatin and losartan.  Pharmacotherapy (Medications Ordered): Meds ordered this encounter  Medications  . oxyCODONE-acetaminophen (PERCOCET) 5-325 MG tablet    Sig: Take 1-2 tablets by mouth daily as needed for severe pain. Must last 30 days.    Dispense:  60 tablet    Refill:  0    Chronic Pain. (STOP Act - Not applicable). Fill one day early if closed on scheduled refill date.   Follow-up plan:   Return in about 3 months (around 08/13/2020) for Medication Management, in person.   Recent Visits No visits were found meeting these conditions. Showing recent visits within past 90 days and meeting all other requirements Today's Visits Date Type Provider Dept  05/15/20 Office Visit Gillis Santa, MD [facility]  Showing today's visits and meeting all other requirements Future Appointments Date Type Provider Dept  08/09/20 Appointment Gillis Santa, MD [facility]  Showing future appointments within next 90 days and meeting all other requirements  I discussed the assessment and treatment plan with the patient. The patient was  provided an opportunity to ask questions and all were answered. The patient agreed with the plan and demonstrated an understanding of the instructions.  Patient advised to call back or seek an in-person evaluation if the symptoms or condition worsens.  Duration of encounter: 54minutes.  Note by: Gillis Santa, MD Date: 05/15/2020; Time: 9:23 AM

## 2020-05-15 NOTE — Progress Notes (Signed)
Nursing Pain Medication Assessment:  Safety precautions to be maintained throughout the outpatient stay will include: orient to surroundings, keep bed in low position, maintain call bell within reach at all times, provide assistance with transfer out of bed and ambulation.  Medication Inspection Compliance: Pill count conducted under aseptic conditions, in front of the patient. Neither the pills nor the bottle was removed from the patient's sight at any time. Once count was completed pills were immediately returned to the patient in their original bottle.  Medication: Oxycodone/APAP Pill/Patch Count: 10 of 60 pills remain Pill/Patch Appearance: Markings consistent with prescribed medication Bottle Appearance: Standard pharmacy container. Clearly labeled. Filled Date: 02/14/2020 Last Medication intake:  Shannon Cortez

## 2020-07-04 DIAGNOSIS — F411 Generalized anxiety disorder: Secondary | ICD-10-CM | POA: Insufficient documentation

## 2020-08-09 ENCOUNTER — Encounter: Payer: Self-pay | Admitting: Student in an Organized Health Care Education/Training Program

## 2020-08-09 ENCOUNTER — Ambulatory Visit
Payer: BC Managed Care – PPO | Attending: Student in an Organized Health Care Education/Training Program | Admitting: Student in an Organized Health Care Education/Training Program

## 2020-08-09 ENCOUNTER — Other Ambulatory Visit: Payer: Self-pay

## 2020-08-09 VITALS — BP 154/81 | HR 88 | Temp 97.3°F | Resp 16 | Ht 62.0 in | Wt 182.0 lb

## 2020-08-09 DIAGNOSIS — G894 Chronic pain syndrome: Secondary | ICD-10-CM | POA: Insufficient documentation

## 2020-08-09 DIAGNOSIS — Z981 Arthrodesis status: Secondary | ICD-10-CM | POA: Diagnosis present

## 2020-08-09 DIAGNOSIS — M7918 Myalgia, other site: Secondary | ICD-10-CM | POA: Insufficient documentation

## 2020-08-09 DIAGNOSIS — M542 Cervicalgia: Secondary | ICD-10-CM | POA: Diagnosis present

## 2020-08-09 DIAGNOSIS — Z9889 Other specified postprocedural states: Secondary | ICD-10-CM | POA: Insufficient documentation

## 2020-08-09 MED ORDER — METHOCARBAMOL 500 MG PO TABS: 500.0000 mg | ORAL_TABLET | Freq: Three times a day (TID) | ORAL | 5 refills | Status: DC | PRN

## 2020-08-09 MED ORDER — OXYCODONE-ACETAMINOPHEN 5-325 MG PO TABS: 1.0000 | ORAL_TABLET | Freq: Two times a day (BID) | ORAL | 0 refills | Status: AC | PRN

## 2020-08-09 NOTE — Progress Notes (Signed)
PROVIDER NOTE: Information contained herein reflects review and annotations entered in association with encounter. Interpretation of such information and data should be left to medically-trained personnel. Information provided to patient can be located elsewhere in the medical record under "Patient Instructions". Document created using STT-dictation technology, any transcriptional errors that may result from process are unintentional.    Patient: Shannon Cortez  Service Category: E/M  Provider: Gillis Santa, MD  DOB: 12/22/1965  DOS: 08/09/2020  Specialty: Interventional Pain Management  MRN: [id number]  Setting: Ambulatory outpatient  PCP: Pcp, No  Type: Established Patient    Referring Provider: No ref. provider found  Location: Office  Delivery: Face-to-face     HPI  [name], a 55 y.o. year old female, is here today because of her Chronic pain syndrome [G89.4]. [name] primary complain today is Neck Pain and Shoulder Pain (left) Last encounter: My last encounter with her was on 05/15/2020. Pertinent problems: [name] has Cellulitis; Chronic pain syndrome; Hx of fusion of cervical spine; Neck pain; and Myofascial pain syndrome on their pertinent problem list. Pain Assessment: Severity of Chronic pain is reported as a 5 /10. Location: Neck (ALSO left shoulder)  /sometimes shoots to right waist, then more rarely may shoot to right knee. Onset: More than a month ago. Quality: Sharp,Shooting. Timing: Intermittent. Modifying factor(s): meds. Vitals:  height is $RemoveB'5\' 2"'GWDpHrZC$  (1.575 m) and weight is 182 lb (82.6 kg). Her temporal temperature is 97.3 F (36.3 C) (abnormal). Her blood pressure is 154/81 (abnormal) and her pulse is 88. Her respiration is 16 and oxygen saturation is 100%.   Reason for encounter: medication management.    No change in medical history since last visit.  Patient's pain is at baseline.  Patient continues multimodal pain regimen as prescribed.  States that it  provides pain relief and improvement in functional status.   Pharmacotherapy Assessment   Analgesic: Percocet 5 mg daily as needed, quantity 60 last 3 months    Monitoring: [place] PMP: PDMP reviewed during this encounter.       Pharmacotherapy: No side-effects or adverse reactions reported. Compliance: No problems identified. Effectiveness: Clinically acceptable.  Shannon Patience, RN  08/09/2020  8:13 AM  Sign when Signing Visit Nursing Pain Medication Assessment:  Safety precautions to be maintained throughout the outpatient stay will include: orient to surroundings, keep bed in low position, maintain call bell within reach at all times, provide assistance with transfer out of bed and ambulation.  Medication Inspection Compliance: Pill count conducted under aseptic conditions, in front of the patient. Neither the pills nor the bottle was removed from the patient's sight at any time. Once count was completed pills were immediately returned to the patient in their original bottle.  Medication: Oxycodone/APAP Pill/Patch Count: 14.25 of 60 pills remain Pill/Patch Appearance: Markings consistent with prescribed medication Bottle Appearance: Standard pharmacy container. Clearly labeled. Filled Date: 1 / 68 / 2022 Last Medication intake:  Yesterday    UDS:  Summary  Date Value Ref Range Status  06/30/2019 Note  Final    Comment:    ==================================================================== ToxASSURE Select 13 (MW) ==================================================================== Test                             Result       Flag       Units Drug Present and Declared for Prescription Verification   Oxycodone  132          EXPECTED   ng/mg creat   Noroxycodone                   311          EXPECTED   ng/mg creat    Sources of oxycodone include scheduled prescription medications.    Noroxycodone is an expected metabolite of  oxycodone. ==================================================================== Test                      Result    Flag   Units      Ref Range   Creatinine              71               mg/dL      >=20 ==================================================================== Declared Medications:  The flagging and interpretation on this report are based on the  following declared medications.  Unexpected results may arise from  inaccuracies in the declared medications.  **Note: The testing scope of this panel includes these medications:  Oxycodone (Percocet)  **Note: The testing scope of this panel does not include the  following reported medications:  Acetaminophen (Percocet)  Aspirin  Atenolol (Tenormin)  Atorvastatin (Lipitor)  Hydrochlorothiazide  Ibuprofen (Advil)  Losartan (Cozaar)  Methocarbamol (Robaxin)  Topical ==================================================================== For clinical consultation, please call [phone]. ====================================================================      ROS  Constitutional: Denies any fever or chills Gastrointestinal: No reported hemesis, hematochezia, vomiting, or acute GI distress Musculoskeletal: Denies any acute onset joint swelling, redness, loss of ROM, or weakness Neurological: No reported episodes of acute onset apraxia, aphasia, dysarthria, agnosia, amnesia, paralysis, loss of coordination, or loss of consciousness  Medication Review  NON FORMULARY, aspirin EC, atorvastatin, escitalopram, ibuprofen, losartan, methocarbamol, and oxyCODONE-acetaminophen  History Review  Allergy: [name] is allergic to toradol [ketorolac tromethamine] and sulfa antibiotics. Drug: [name]  reports no history of drug use. Alcohol:  reports no history of alcohol use. Tobacco:  reports that she quit smoking about 6 years ago. She has never used smokeless tobacco. Social: [name]  reports that she quit smoking  about 6 years ago. She has never used smokeless tobacco. She reports that she does not drink alcohol and does not use drugs. Medical:  has a past medical history of DDD (degenerative disc disease), lumbosacral, Hyperlipidemia, and Hypertension. Surgical: [name]  has a past surgical history that includes Neck surgery; Abdominal hysterectomy; and Nasal reconstruction with septal repair. Family: family history includes Hepatitis in her mother.  Laboratory Chemistry Profile   Renal Lab Results  Component Value Date   BUN 13 04/18/2018   CREATININE 0.88 04/18/2018   GFRAA >60 04/18/2018   GFRNONAA >60 04/18/2018     Hepatic Lab Results  Component Value Date   AST 22 04/18/2018   ALT 21 04/18/2018   ALBUMIN 3.6 04/18/2018   ALKPHOS 94 04/18/2018   LIPASE 33 04/18/2018     Electrolytes Lab Results  Component Value Date   NA 139 04/18/2018   K 3.6 04/18/2018   CL 106 04/18/2018   CALCIUM 8.7 (L) 04/18/2018     Bone No results found for: VD25OH, [id number], IR5188CZ6, SA6301SW1, 25OHVITD1, 25OHVITD2, 25OHVITD3, TESTOFREE, TESTOSTERONE   Inflammation (CRP: Acute Phase) (ESR: Chronic Phase) Lab Results  Component Value Date   LATICACIDVEN 1.13 04/16/2018       Note: Above Lab results reviewed.  Recent Imaging Review  DG Wrist Complete  Left CLINICAL DATA:  Left posterior wrist pain laceration following a fall today.  EXAM: LEFT WRIST - COMPLETE 3+ VIEW  COMPARISON:  None.  FINDINGS: Shallow linear soft tissue defect in the dorsal aspect of the wrist. Two small infused ossicles distal to the ulnar styloid. No acute fracture or dislocation.  IMPRESSION: No acute fracture or dislocation.  Electronically Signed   By: Claudie Revering M.D.   On: 04/12/2018 21:36 Note: Reviewed        Physical Exam  General appearance: Well nourished, well developed, and well hydrated. In no apparent acute distress Mental status: Alert, oriented x 3 (person, place, & time)        Respiratory: No evidence of acute respiratory distress Eyes: PERLA Vitals: BP (!) 154/81   Pulse 88   Temp (!) 97.3 F (36.3 C) (Temporal)   Resp 16   Ht $R'5\' 2"'KE$  (1.575 m)   Wt 182 lb (82.6 kg)   SpO2 100%   BMI 33.29 kg/m  BMI: Estimated body mass index is 33.29 kg/m as calculated from the following:   Height as of this encounter: $RemoveBeforeD'5\' 2"'VytcksStSYdtEq$  (1.575 m).   Weight as of this encounter: 182 lb (82.6 kg). Ideal: Ideal body weight: 50.1 kg (110 lb 7.2 oz) Adjusted ideal body weight: 63.1 kg (139 lb 1.1 oz)   +neck pain with extension, shoulder pain (L>R) Hx of cervical spinal fusion 5 out of 5 strength bilateral upper extremity: Shoulder abduction, elbow flexion, elbow extension, thumb extension. 5 out of 5 strength bilateral lower extremity: Plantar flexion, dorsiflexion, knee flexion, knee extension.  Assessment   Status Diagnosis  Controlled Controlled Controlled 1. Chronic pain syndrome   2. Hx of fusion of cervical spine   3. H/O cervical spine surgery (ACDF C5-C7 in [place] 2016)   4. Myofascial pain syndrome   5. Neck pain       Plan of Care   Ms. Antonea Gaut has a current medication list which includes the following long-term medication(s): atorvastatin, escitalopram, and losartan.  Pharmacotherapy (Medications Ordered): Meds ordered this encounter  Medications  . oxyCODONE-acetaminophen (PERCOCET/ROXICET) 5-325 MG tablet    Sig: Take 1 tablet by mouth 2 (two) times daily as needed for severe pain.    Dispense:  60 tablet    Refill:  0    For chronic pain  . methocarbamol (ROBAXIN) 500 MG tablet    Sig: Take 1 tablet (500 mg total) by mouth every 8 (eight) hours as needed for muscle spasms.    Dispense:  90 tablet    Refill:  5    Do not place this medication, or any other prescription from our practice, on "Automatic Refill". Patient may have prescription filled one day early if pharmacy is closed on scheduled refill date.   Orders:  Orders Placed This  Encounter  Procedures  . ToxASSURE Select 13 (MW), Urine    Volume: 30 ml(s). Minimum 3 ml of urine is needed. Document temperature of fresh sample. Indications: Long term (current) use of opiate analgesic [phone])    Order Specific Question:   Release to patient    Answer:   Immediate   Follow-up plan:   Return in about 3 months (around 11/08/2020) for Medication Management, in person.   Recent Visits Date Type Provider Dept  05/15/20 Office Visit Shannon Santa, MD [facility]  Showing recent visits within past 90 days and meeting all other requirements Today's Visits Date Type Provider Dept  08/09/20 Office Visit Shannon Santa,  MD [facility]  Showing today's visits and meeting all other requirements Future Appointments No visits were found meeting these conditions. Showing future appointments within next 90 days and meeting all other requirements  I discussed the assessment and treatment plan with the patient. The patient was provided an opportunity to ask questions and all were answered. The patient agreed with the plan and demonstrated an understanding of the instructions.  Patient advised to call back or seek an in-person evaluation if the symptoms or condition worsens.  Duration of encounter:30 minutes.  Note by: Shannon Santa, MD Date: 08/09/2020; Time: 8:32 AM

## 2020-08-09 NOTE — Progress Notes (Signed)
Nursing Pain Medication Assessment:  Safety precautions to be maintained throughout the outpatient stay will include: orient to surroundings, keep bed in low position, maintain call bell within reach at all times, provide assistance with transfer out of bed and ambulation.  Medication Inspection Compliance: Pill count conducted under aseptic conditions, in front of the patient. Neither the pills nor the bottle was removed from the patient's sight at any time. Once count was completed pills were immediately returned to the patient in their original bottle.  Medication: Oxycodone/APAP Pill/Patch Count: 14.25 of 60 pills remain Pill/Patch Appearance: Markings consistent with prescribed medication Bottle Appearance: Standard pharmacy container. Clearly labeled. Filled Date: 1 / 11 / 2022 Last Medication intake:  Yesterday

## 2020-08-16 ENCOUNTER — Encounter: Payer: Self-pay | Admitting: Student in an Organized Health Care Education/Training Program

## 2020-08-16 ENCOUNTER — Telehealth: Payer: Self-pay | Admitting: *Deleted

## 2020-08-16 LAB — TOXASSURE SELECT 13 (MW), URINE

## 2020-08-16 NOTE — Progress Notes (Signed)
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  This is a blatant violation of her pain contract.  Please call and cancel all remaining opioid prescriptions at her pharmacy.  Letter will be mailed out to her explaining this.

## 2020-08-16 NOTE — Telephone Encounter (Signed)
Contacted [facility] to cancel any remaining prescriptions for opioids. There were none remaining.

## 2020-08-27 ENCOUNTER — Telehealth: Payer: Self-pay | Admitting: Student in an Organized Health Care Education/Training Program

## 2020-08-27 NOTE — Telephone Encounter (Signed)
Patient states she isnt going to be home to get the letter that was sent certified mail. She would like to know UDS results and contents of letter. Also Dr. Cherylann Ratel canceled her appts. And med refills. Please call and advise patient of these results.

## 2020-08-27 NOTE — Telephone Encounter (Signed)
UDS results read to patient.

## 2020-09-27 ENCOUNTER — Telehealth: Payer: Self-pay | Admitting: Student in an Organized Health Care Education/Training Program

## 2020-09-27 NOTE — Telephone Encounter (Signed)
Patient would like to talk to Dr. Cherylann Ratel about the Drug Screen. She is just trying to figure out what happened. She has never had this happen before. And it is very upsetting to her. Can we please bring her in for an appt.

## 2020-09-27 NOTE — Telephone Encounter (Signed)
What are your thoughts? Would you like to schedule her for a follow up to discuss her UDS? I will be glad to call her.

## 2020-09-27 NOTE — Telephone Encounter (Signed)
Patient is calling about certified letter sent regarding her UDS results. She travels a lot with her job and is unable to be at home to get certified letter. She wants to know if Nurse or Dr. Cherylann Ratel can tell her results over the phone.  She started off talking about how she went in to do the UDS with a empty cup, no label, was allowed to take all her stuff in with her, and this has her worried as she never has failed a UDS before and has to do them for her job as well. Please call patient.

## 2020-09-27 NOTE — Telephone Encounter (Signed)
No follow up, any further questions for this patient should be directed to Michelle Nasuti please

## 2020-10-02 NOTE — Telephone Encounter (Signed)
Please advise. She has already been sent the letter for discharge, so is there any point in scheduling to talk about the UDS?

## 2020-10-02 NOTE — Telephone Encounter (Signed)
Sent msg to [place].

## 2020-10-18 IMAGING — DX DG WRIST COMPLETE 3+V*L*
4 series · 4 of 4 positions shown · non-contrast
Comparison: None.

CLINICAL DATA: Left posterior wrist pain laceration following a
fall today.

EXAM:
LEFT WRIST - COMPLETE 3+ VIEW

[wrist obl]
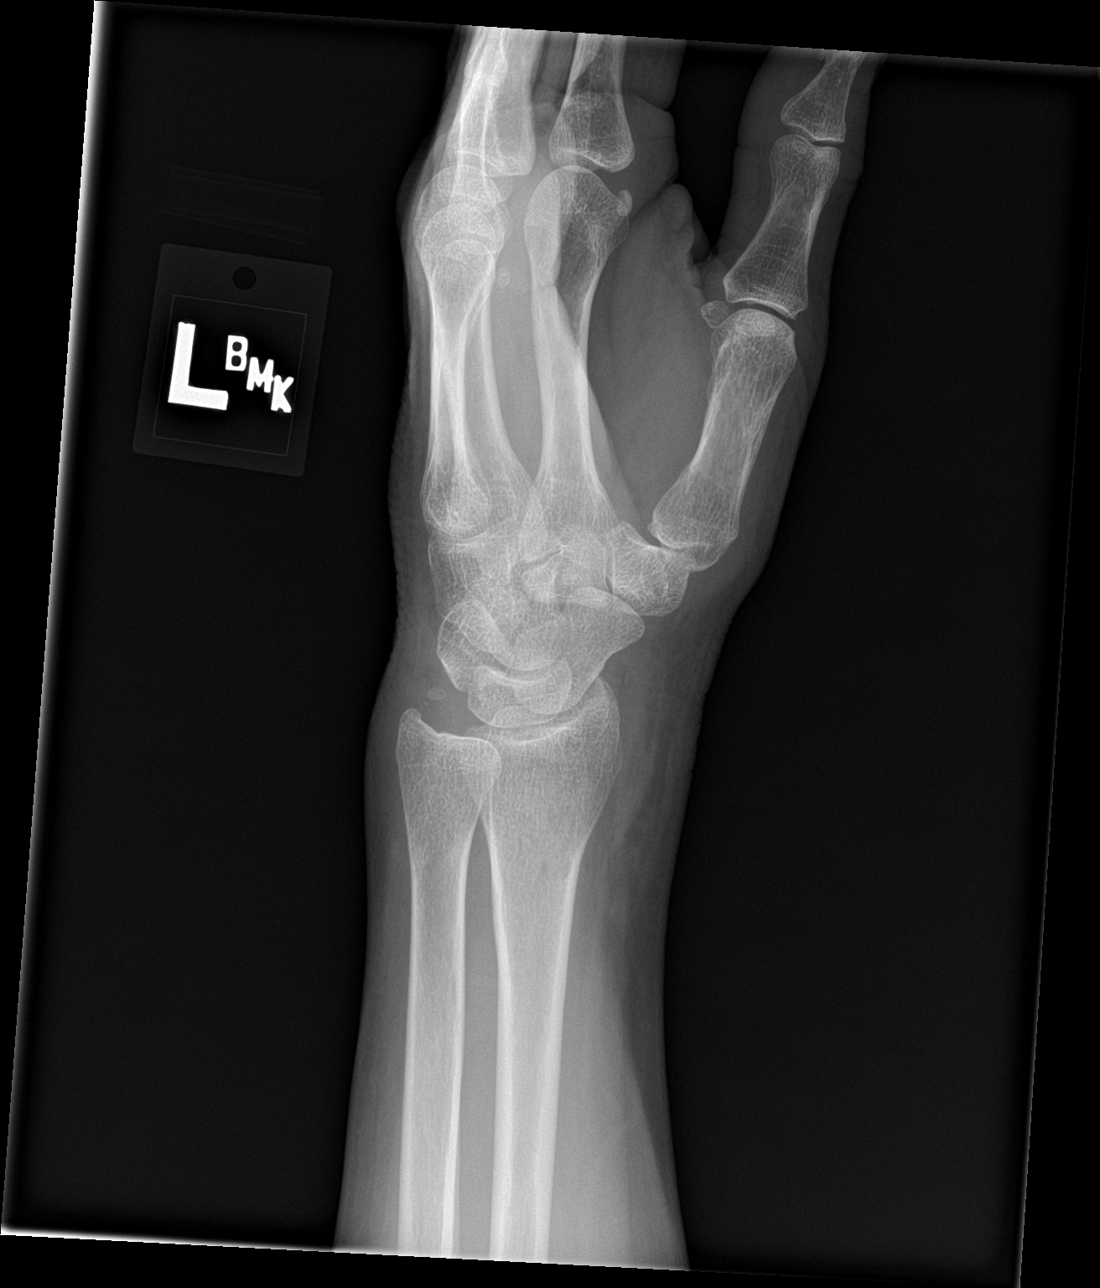

[wrist lat]
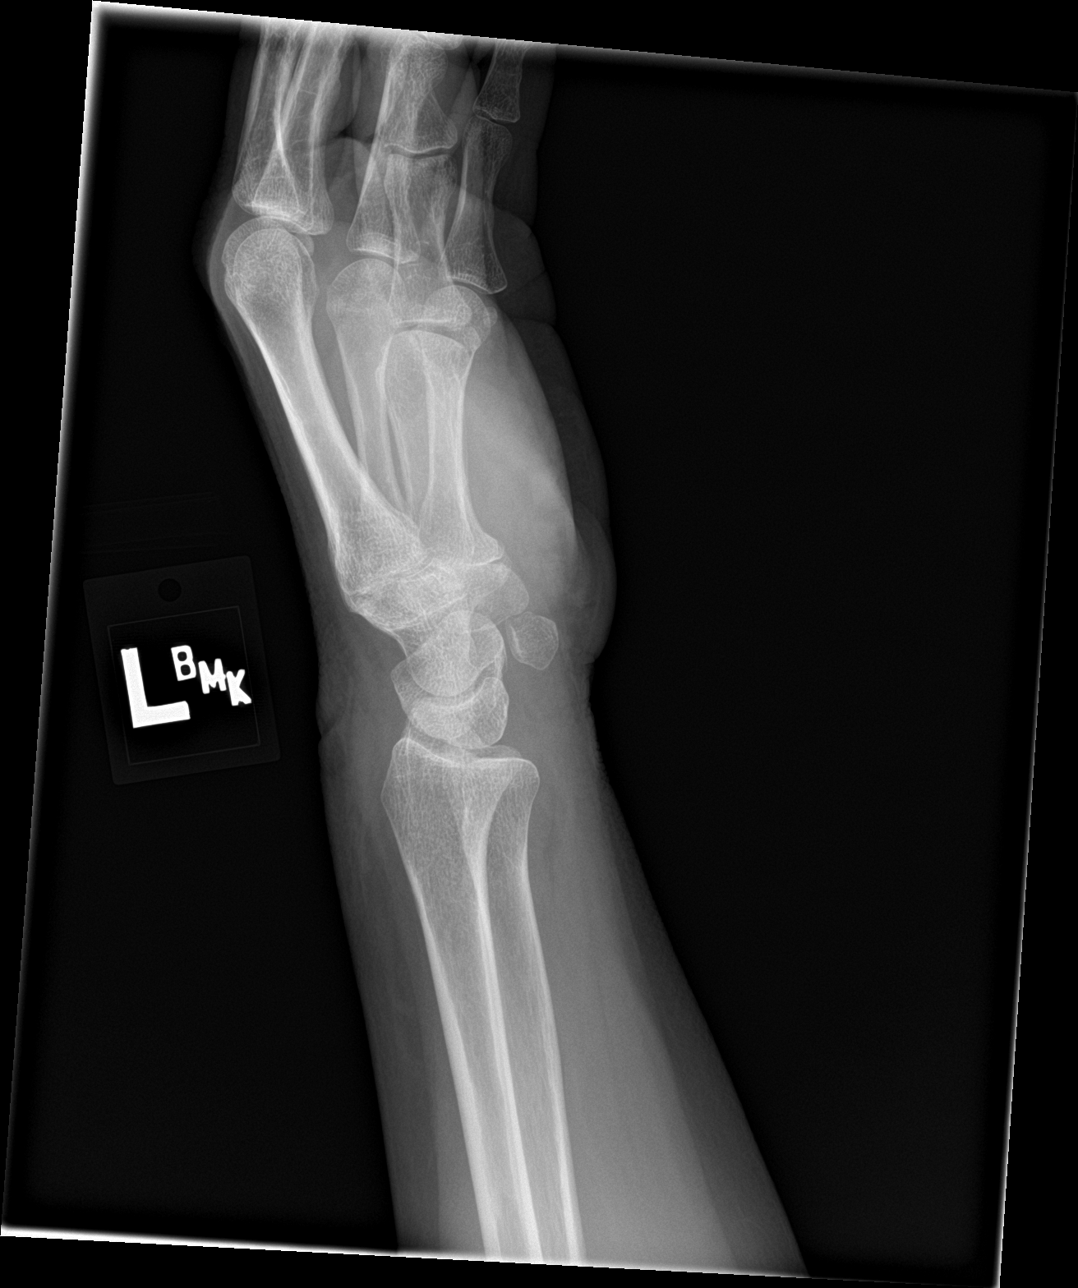

[wrist ap (1 of 2)]
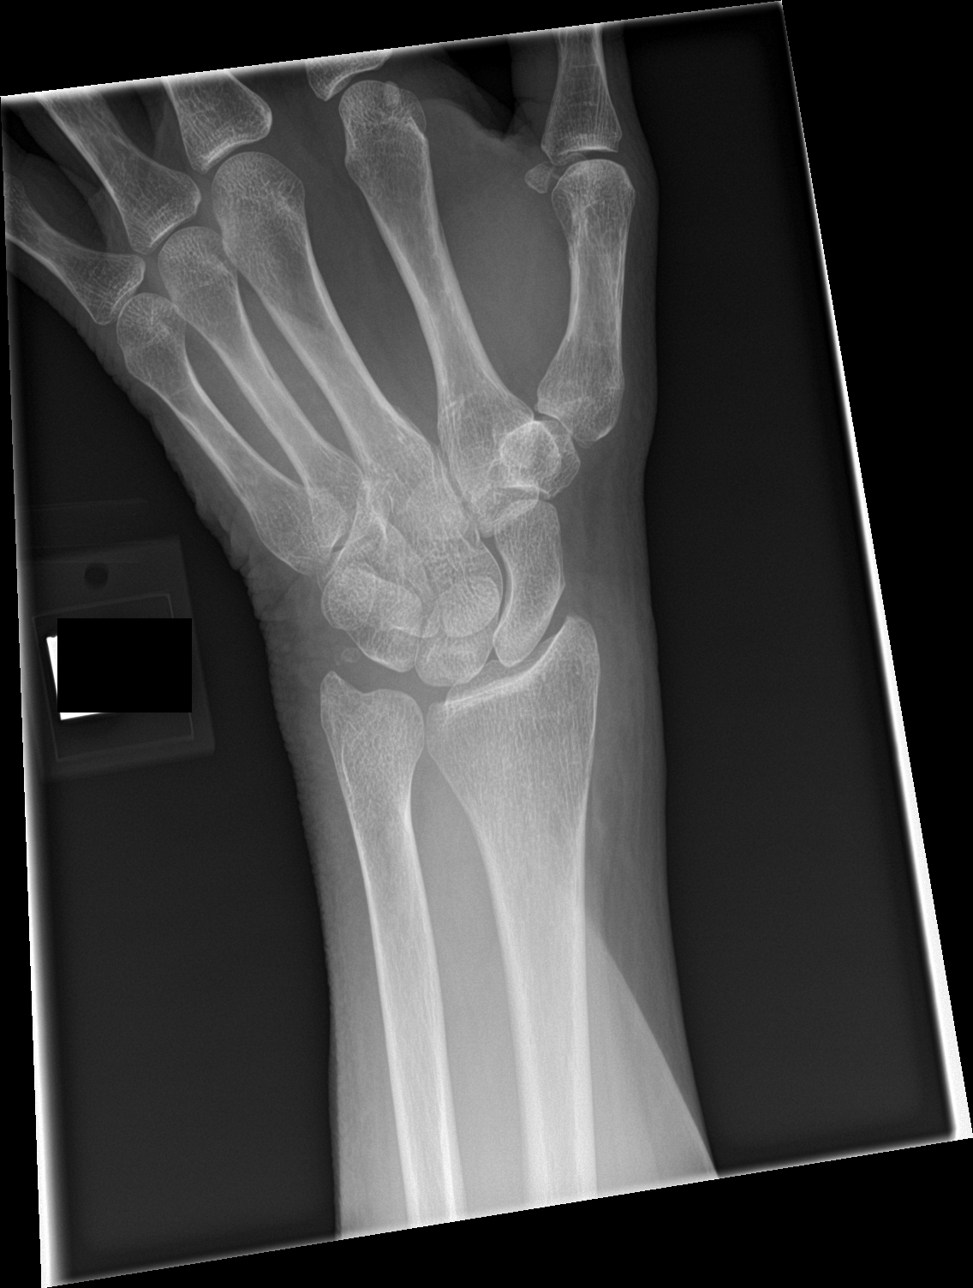

[wrist ap (2 of 2)]
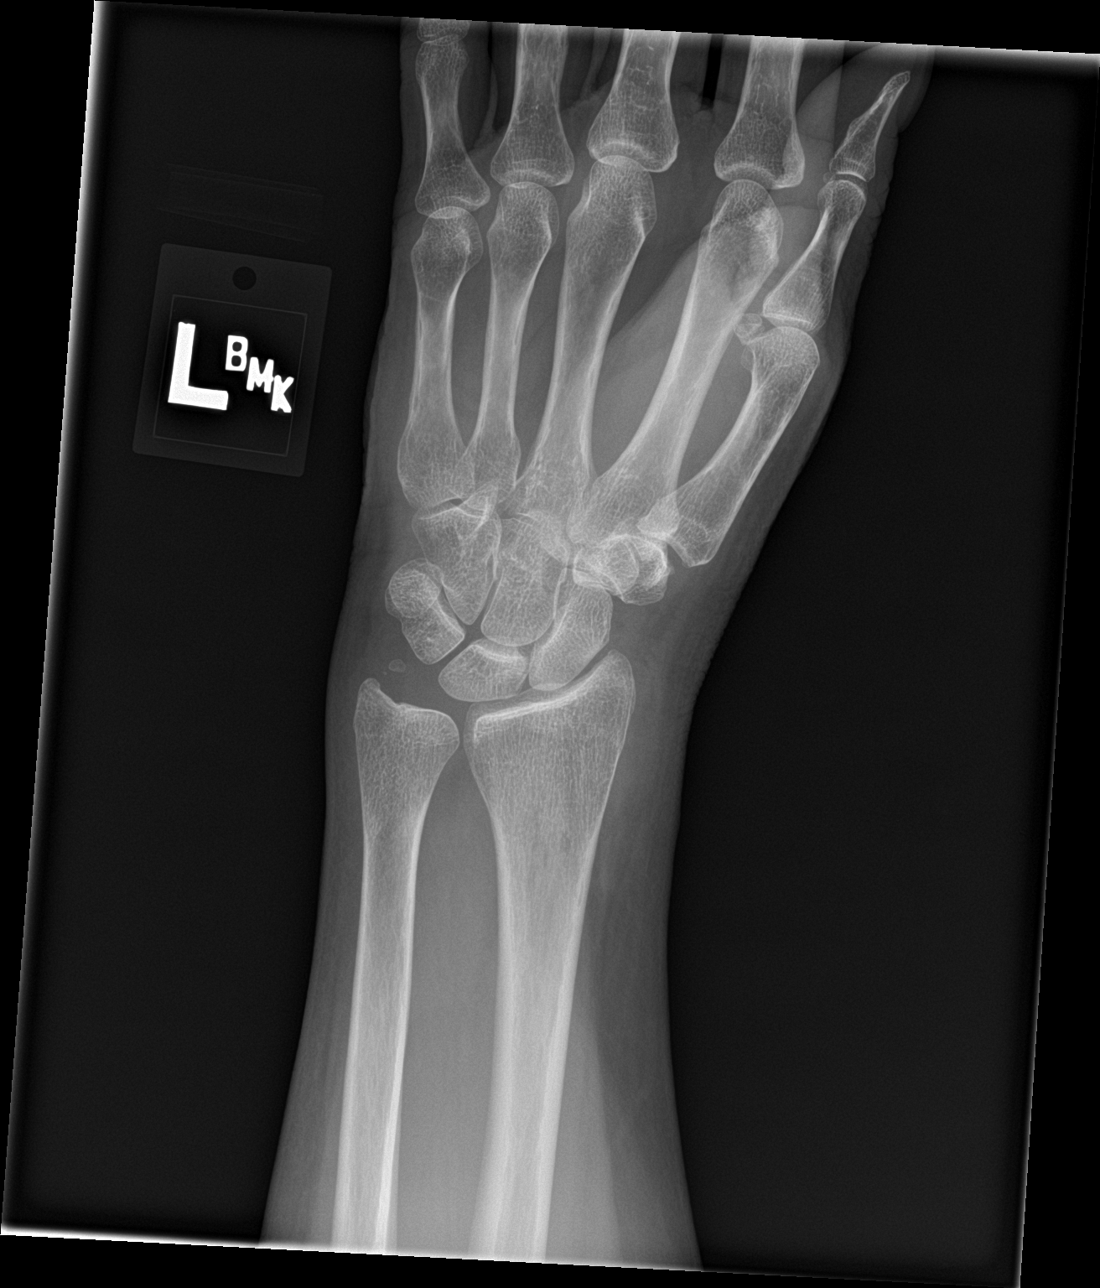

[4 of 4 positions shown; findings below may reference images not displayed]

FINDINGS: Shallow linear soft tissue defect in the dorsal aspect of the wrist.
Two small infused ossicles distal to the ulnar styloid. No acute
fracture or dislocation.
IMPRESSION: No acute fracture or dislocation.

## 2020-11-01 ENCOUNTER — Other Ambulatory Visit: Payer: Self-pay | Admitting: Student in an Organized Health Care Education/Training Program

## 2020-11-01 ENCOUNTER — Telehealth: Payer: Self-pay | Admitting: *Deleted

## 2020-11-01 DIAGNOSIS — G894 Chronic pain syndrome: Secondary | ICD-10-CM

## 2020-11-01 NOTE — Telephone Encounter (Signed)
Spoke with patient today regarding urine drug screen. She is willing to come in today to provide a repeat urine. Spoke with Dr. Cherylann Ratel to confirm this is a possibility. He agrees. Patient informed that screen will take a few days. We will contact with results and plan of care.

## 2020-11-06 ENCOUNTER — Encounter: Payer: BC Managed Care – PPO | Admitting: Student in an Organized Health Care Education/Training Program

## 2020-11-09 LAB — TOXASSURE SELECT 13 (MW), URINE

## 2020-11-23 ENCOUNTER — Telehealth: Payer: Self-pay | Admitting: *Deleted

## 2020-11-23 NOTE — Telephone Encounter (Signed)
Patient called to get urine drug screen results. Would like to be rescheduled. Are you in agreement with this?

## 2020-12-03 ENCOUNTER — Ambulatory Visit
Payer: BC Managed Care – PPO | Attending: Student in an Organized Health Care Education/Training Program | Admitting: Student in an Organized Health Care Education/Training Program

## 2020-12-03 ENCOUNTER — Encounter: Payer: Self-pay | Admitting: Student in an Organized Health Care Education/Training Program

## 2020-12-03 VITALS — BP 154/92 | HR 95 | Temp 97.0°F

## 2020-12-03 DIAGNOSIS — Z9889 Other specified postprocedural states: Secondary | ICD-10-CM | POA: Insufficient documentation

## 2020-12-03 DIAGNOSIS — M7918 Myalgia, other site: Secondary | ICD-10-CM | POA: Insufficient documentation

## 2020-12-03 DIAGNOSIS — Z981 Arthrodesis status: Secondary | ICD-10-CM

## 2020-12-03 DIAGNOSIS — G894 Chronic pain syndrome: Secondary | ICD-10-CM | POA: Diagnosis not present

## 2020-12-03 DIAGNOSIS — M542 Cervicalgia: Secondary | ICD-10-CM

## 2020-12-03 MED ORDER — OXYCODONE-ACETAMINOPHEN 5-325 MG PO TABS: 1.0000 | ORAL_TABLET | Freq: Two times a day (BID) | ORAL | 0 refills | Status: AC | PRN

## 2020-12-03 NOTE — Progress Notes (Signed)
PROVIDER NOTE: Information contained herein reflects review and annotations entered in association with encounter. Interpretation of such information and data should be left to medically-trained personnel. Information provided to patient can be located elsewhere in the medical record under "Patient Instructions". Document created using STT-dictation technology, any transcriptional errors that may result from process are unintentional.    Patient: Shannon Cortez  Service Category: E/M  Provider: Gillis Santa, MD  DOB: 1965/08/02  DOS: 12/03/2020  Specialty: Interventional Pain Management  MRN: [id number]  Setting: Ambulatory outpatient  PCP: Pcp, No  Type: Established Patient    Referring Provider: No ref. provider found  Location: Office  Delivery: Face-to-face     HPI  [name], a 55 y.o. year old female, is here today because of her Chronic pain syndrome [G89.4]. [name] primary complain today is Neck Pain and Back Pain (Lower back radiates down to side of leg to knee) Last encounter: My last encounter with her was on 09/27/2020. Pertinent problems: [name] has Cellulitis; Chronic pain syndrome; Hx of fusion of cervical spine; Neck pain; and Myofascial pain syndrome on their pertinent problem list. Pain Assessment: Severity of Chronic pain is reported as a 7 /10. Location: Neck Left/ (will make right hand tingling and numb). Onset: More than a month ago. Quality: Aching, Burning, Numbness, Discomfort. Timing: Constant. Modifying factor(s): heat, non movement, PT. Vitals:  temperature is 97 F (36.1 C) (abnormal). Her blood pressure is 154/92 (abnormal) and her pulse is 95.   Reason for encounter: medication management.   Pharmacotherapy Assessment  Analgesic: Percocet 5 mg daily as needed, quantity 60 last 3 months    Monitoring: [place] PMP: PDMP reviewed during this encounter.       Pharmacotherapy: No side-effects or adverse reactions reported. Compliance: No problems  identified. Effectiveness: Clinically acceptable.  Ignatius Specking, RN  12/03/2020  2:05 PM  Sign when Signing Visit Nursing Pain Medication Assessment:  Safety precautions to be maintained throughout the outpatient stay will include: orient to surroundings, keep bed in low position, maintain call bell within reach at all times, provide assistance with transfer out of bed and ambulation.  Medication Inspection Compliance: Pill count conducted under aseptic conditions, in front of the patient. Neither the pills nor the bottle was removed from the patient's sight at any time. Once count was completed pills were immediately returned to the patient in their original bottle.  Medication: Oxycodone/APAP Pill/Patch Count:  5.5 of 60 pills remain Pill/Patch Appearance: Markings consistent with prescribed medication Bottle Appearance: Standard pharmacy container. Clearly labeled. Filled Date: 4 / 7 / 2022 Last Medication intake:  Yesterday   UDS:  Summary  Date Value Ref Range Status  11/01/2020 Note  Final    Comment:    ==================================================================== [employer] Select 13 (MW) ==================================================================== Test                             Result       Flag       Units  Drug Present not Declared for Prescription Verification   Noroxycodone                   148          UNEXPECTED ng/mg creat    Noroxycodone is an expected metabolite of oxycodone. Sources of    oxycodone include scheduled prescription medications.  ==================================================================== Test  Result    Flag   Units      Ref Range   Creatinine              81               mg/dL      >=20 ==================================================================== Declared Medications:  The flagging and interpretation on this report are based on the  following declared medications.  Unexpected results may  arise from  inaccuracies in the declared medications.   **Note: The testing scope of this panel does not include the  following reported medications:   Aspirin  Atorvastatin  Cannabidiol  Diphenhydramine (Benadryl)  Escitalopram (Lexapro)  Ibuprofen  Losartan  Methocarbamol ==================================================================== For clinical consultation, please call [phone]. ====================================================================      ROS  Constitutional: Denies any fever or chills Gastrointestinal: No reported hemesis, hematochezia, vomiting, or acute GI distress Musculoskeletal: Denies any acute onset joint swelling, redness, loss of ROM, or weakness Neurological: No reported episodes of acute onset apraxia, aphasia, dysarthria, agnosia, amnesia, paralysis, loss of coordination, or loss of consciousness  Medication Review  AMBULATORY NON FORMULARY MEDICATION, NON FORMULARY, aspirin EC, atorvastatin, diphenhydrAMINE, escitalopram, ibuprofen, losartan, methocarbamol, and oxyCODONE-acetaminophen  History Review  Allergy: [name] is allergic to hyronan [sodium hyaluronate & lidocaine], toradol [ketorolac tromethamine], and sulfa antibiotics. Drug: [name]  reports no history of drug use. Alcohol:  reports no history of alcohol use. Tobacco:  reports that she quit smoking about 6 years ago. She has never used smokeless tobacco. Social: [name]  reports that she quit smoking about 6 years ago. She has never used smokeless tobacco. She reports that she does not drink alcohol and does not use drugs. Medical:  has a past medical history of DDD (degenerative disc disease), lumbosacral, Hyperlipidemia, and Hypertension. Surgical: [name]  has a past surgical history that includes Neck surgery; Abdominal hysterectomy; and Nasal reconstruction with septal repair. Family: family history includes Hepatitis in her mother.  Laboratory  Chemistry Profile   Renal Lab Results  Component Value Date   BUN 13 04/18/2018   CREATININE 0.88 04/18/2018   GFRAA >60 04/18/2018   GFRNONAA >60 04/18/2018    Hepatic Lab Results  Component Value Date   AST 22 04/18/2018   ALT 21 04/18/2018   ALBUMIN 3.6 04/18/2018   ALKPHOS 94 04/18/2018   LIPASE 33 04/18/2018    Electrolytes Lab Results  Component Value Date   NA 139 04/18/2018   K 3.6 04/18/2018   CL 106 04/18/2018   CALCIUM 8.7 (L) 04/18/2018    Bone No results found for: VD25OH, [id number], KY7062BJ6, EG3151VO1, 25OHVITD1, 25OHVITD2, 25OHVITD3, TESTOFREE, TESTOSTERONE  Inflammation (CRP: Acute Phase) (ESR: Chronic Phase) Lab Results  Component Value Date   LATICACIDVEN 1.13 04/16/2018         Note: Above Lab results reviewed.  Recent Imaging Review  DG Wrist Complete Left CLINICAL DATA:  Left posterior wrist pain laceration following a fall today.  EXAM: LEFT WRIST - COMPLETE 3+ VIEW  COMPARISON:  None.  FINDINGS: Shallow linear soft tissue defect in the dorsal aspect of the wrist. Two small infused ossicles distal to the ulnar styloid. No acute fracture or dislocation.  IMPRESSION: No acute fracture or dislocation.  Electronically Signed   By: Claudie Revering M.D.   On: 04/12/2018 21:36 Note: Reviewed        Physical Exam  General appearance: Well nourished, well developed, and well hydrated. In no apparent acute distress Mental status: Alert,  oriented x 3 (person, place, & time)       Respiratory: No evidence of acute respiratory distress Eyes: PERLA Vitals: BP (!) 154/92   Pulse 95   Temp (!) 97 F (36.1 C)  BMI: Estimated body mass index is 33.29 kg/m as calculated from the following:   Height as of 08/09/20: $RemoveBe'5\' 2"'rsIxktQmV$  (1.575 m).   Weight as of 08/09/20: 182 lb (82.6 kg). Ideal: Patient weight not recorded  Cervical spine pain Limited cervical extension Surgical scar present 5 out of 5 strength bilateral upper extremity: Shoulder abduction,  elbow flexion, elbow extension, thumb extension.   Assessment   Status Diagnosis  Controlled Controlled Controlled 1. Chronic pain syndrome   2. Hx of fusion of cervical spine   3. Myofascial pain syndrome   4. Neck pain   5. H/O cervical spine surgery ([facility] C5-C7 in [place])       Plan of Care    [name] has a current medication list which includes the following long-term medication(s): atorvastatin, diphenhydramine, escitalopram, and losartan.  Pharmacotherapy (Medications Ordered): Meds ordered this encounter  Medications   oxyCODONE-acetaminophen (PERCOCET/ROXICET) 5-325 MG tablet    Sig: Take 1 tablet by mouth every 12 (twelve) hours as needed for severe pain.    Dispense:  60 tablet    Refill:  0   Orders:  No orders of the defined types were placed in this encounter.  Follow-up plan:   Return in about 3 months (around 03/05/2021) for Medication Management, in person.    Recent Visits No visits were found meeting these conditions. Showing recent visits within past 90 days and meeting all other requirements Today's Visits Date Type Provider Dept  12/03/20 Office Visit Gillis Santa, MD [facility]  Showing today's visits and meeting all other requirements Future Appointments No visits were found meeting these conditions. Showing future appointments within next 90 days and meeting all other requirements I discussed the assessment and treatment plan with the patient. The patient was provided an opportunity to ask questions and all were answered. The patient agreed with the plan and demonstrated an understanding of the instructions.  Patient advised to call back or seek an in-person evaluation if the symptoms or condition worsens.  Duration of encounter: 27minutes.  Note by: Gillis Santa, MD Date: 12/03/2020; Time: 2:28 PM

## 2020-12-03 NOTE — Patient Instructions (Signed)
Re-sign pain contract

## 2020-12-03 NOTE — Progress Notes (Signed)
Nursing Pain Medication Assessment:  Safety precautions to be maintained throughout the outpatient stay will include: orient to surroundings, keep bed in low position, maintain call bell within reach at all times, provide assistance with transfer out of bed and ambulation.  Medication Inspection Compliance: Pill count conducted under aseptic conditions, in front of the patient. Neither the pills nor the bottle was removed from the patient's sight at any time. Once count was completed pills were immediately returned to the patient in their original bottle.  Medication: Oxycodone/APAP Pill/Patch Count:  5.5 of 60 pills remain Pill/Patch Appearance: Markings consistent with prescribed medication Bottle Appearance: Standard pharmacy container. Clearly labeled. Filled Date: 4 / 7 / 2022 Last Medication intake:  Yesterday

## 2021-02-21 ENCOUNTER — Other Ambulatory Visit: Payer: Self-pay

## 2021-02-21 ENCOUNTER — Ambulatory Visit
Payer: BC Managed Care – PPO | Attending: Student in an Organized Health Care Education/Training Program | Admitting: Student in an Organized Health Care Education/Training Program

## 2021-02-21 ENCOUNTER — Encounter: Payer: Self-pay | Admitting: Student in an Organized Health Care Education/Training Program

## 2021-02-21 VITALS — BP 143/91 | HR 112 | Temp 96.9°F | Resp 15 | Ht 62.0 in | Wt 182.0 lb

## 2021-02-21 DIAGNOSIS — G894 Chronic pain syndrome: Secondary | ICD-10-CM | POA: Diagnosis not present

## 2021-02-21 DIAGNOSIS — Z981 Arthrodesis status: Secondary | ICD-10-CM | POA: Diagnosis not present

## 2021-02-21 DIAGNOSIS — M7918 Myalgia, other site: Secondary | ICD-10-CM

## 2021-02-21 DIAGNOSIS — Z9889 Other specified postprocedural states: Secondary | ICD-10-CM | POA: Diagnosis present

## 2021-02-21 DIAGNOSIS — M542 Cervicalgia: Secondary | ICD-10-CM | POA: Diagnosis not present

## 2021-02-21 MED ORDER — OXYCODONE-ACETAMINOPHEN 5-325 MG PO TABS: 1.0000 | ORAL_TABLET | Freq: Two times a day (BID) | ORAL | 0 refills | Status: AC

## 2021-02-21 MED ORDER — IBUPROFEN 800 MG PO TABS: 800.0000 mg | ORAL_TABLET | Freq: Three times a day (TID) | ORAL | 2 refills | Status: DC | PRN

## 2021-02-21 NOTE — Progress Notes (Signed)
PROVIDER NOTE: Information contained herein reflects review and annotations entered in association with encounter. Interpretation of such information and data should be left to medically-trained personnel. Information provided to patient can be located elsewhere in the medical record under "Patient Instructions". Document created using STT-dictation technology, any transcriptional errors that may result from process are unintentional.    Patient: Shannon Cortez  Service Category: E/M  Provider: Gillis Santa, MD  DOB: [birth year]  DOS: 02/21/2021  Specialty: Interventional Pain Management  MRN: [id number]  Setting: Ambulatory outpatient  PCP: Pcp, No  Type: Established Patient    Referring Provider: No ref. provider found  Location: Office  Delivery: Face-to-face     HPI  [name], a 55 y.o. year old female, is here today because of her Chronic pain syndrome [G89.4]. [name] primary complain today is Shoulder Pain (left) and Neck Pain Last encounter: My last encounter with her was on 12/03/2020. Pertinent problems: [name] has Cellulitis; Chronic pain syndrome; Hx of fusion of cervical spine; Neck pain; and Myofascial pain syndrome on their pertinent problem list. Pain Assessment: Severity of Chronic pain is reported as a 5 /10. Location: Shoulder Left/"my left upper arm hurts sometimes but I am not sure if related to shoulder; fell 2 week ago on left arm". Onset: More than a month ago. Quality: Burning, Throbbing. Timing: Constant. Modifying factor(s): meds. Vitals:  height is $RemoveB'5\' 2"'mtbaohIy$  (1.575 m) and weight is 182 lb (82.6 kg). Her temporal temperature is 96.9 F (36.1 C) (abnormal). Her blood pressure is 143/91 (abnormal) and her pulse is 112 (abnormal). Her respiration is 15 and oxygen saturation is 99%.   Reason for encounter: medication management.   No change in medical history since last visit.  Patient's pain is at baseline.  Patient continues multimodal pain regimen as  prescribed.  States that it provides pain relief and improvement in functional status. Spasms along left trapezius, applying pressure, heat and massage which helps   Pharmacotherapy Assessment  Analgesic: Percocet 5 mg daily as needed, quantity 60 last 3 months    Monitoring: [place] PMP: PDMP reviewed during this encounter.       Pharmacotherapy: No side-effects or adverse reactions reported. Compliance: No problems identified. Effectiveness: Clinically acceptable.  Rise Patience, RN  02/21/2021  9:08 AM  Sign when Signing Visit Nursing Pain Medication Assessment:  Safety precautions to be maintained throughout the outpatient stay will include: orient to surroundings, keep bed in low position, maintain call bell within reach at all times, provide assistance with transfer out of bed and ambulation.  Medication Inspection Compliance: Pill count conducted under aseptic conditions, in front of the patient. Neither the pills nor the bottle was removed from the patient's sight at any time. Once count was completed pills were immediately returned to the patient in their original bottle.  Medication: Oxycodone/APAP Pill/Patch Count:  13.25 of 60 pills remain Pill/Patch Appearance: Markings consistent with prescribed medication Bottle Appearance: Standard pharmacy container. Clearly labeled. Filled Date: 08 / 01 / 2022 Last Medication intake:  Yesterday      UDS:  Summary  Date Value Ref Range Status  11/01/2020 Note  Final    Comment:    ==================================================================== [employer] Select 13 (MW) ==================================================================== Test                             Result       Flag       Units  Drug Present not  Declared for Prescription Verification   Noroxycodone                   148          UNEXPECTED ng/mg creat    Noroxycodone is an expected metabolite of oxycodone. Sources of    oxycodone include scheduled  prescription medications.  ==================================================================== Test                      Result    Flag   Units      Ref Range   Creatinine              81               mg/dL      >=20 ==================================================================== Declared Medications:  The flagging and interpretation on this report are based on the  following declared medications.  Unexpected results may arise from  inaccuracies in the declared medications.   **Note: The testing scope of this panel does not include the  following reported medications:   Aspirin  Atorvastatin  Cannabidiol  Diphenhydramine (Benadryl)  Escitalopram (Lexapro)  Ibuprofen  Losartan  Methocarbamol ==================================================================== For clinical consultation, please call [phone]. ====================================================================      ROS  Constitutional: Denies any fever or chills Gastrointestinal: No reported hemesis, hematochezia, vomiting, or acute GI distress Musculoskeletal:  left trapezius spasm Neurological: No reported episodes of acute onset apraxia, aphasia, dysarthria, agnosia, amnesia, paralysis, loss of coordination, or loss of consciousness  Medication Review  AMBULATORY NON FORMULARY MEDICATION, NON FORMULARY, aspirin EC, atorvastatin, diphenhydrAMINE, escitalopram, ibuprofen, losartan, methocarbamol, and oxyCODONE-acetaminophen  History Review  Allergy: [name] is allergic to hyronan [sodium hyaluronate & lidocaine], toradol [ketorolac tromethamine], and sulfa antibiotics. Drug: [name]  reports no history of drug use. Alcohol:  reports no history of alcohol use. Tobacco:  reports that she quit smoking about 6 years ago. Her smoking use included cigarettes. She has never used smokeless tobacco. Social: [name]  reports that she quit smoking about 6 years ago. Her smoking use included  cigarettes. She has never used smokeless tobacco. She reports that she does not drink alcohol and does not use drugs. Medical:  has a past medical history of DDD (degenerative disc disease), lumbosacral, Hyperlipidemia, and Hypertension. Surgical: Ms. Coventry  has a past surgical history that includes Neck surgery; Abdominal hysterectomy; and Nasal reconstruction with septal repair. Family: family history includes Hepatitis in her mother.  Laboratory Chemistry Profile   Renal Lab Results  Component Value Date   BUN 13 04/18/2018   CREATININE 0.88 04/18/2018   GFRAA >60 04/18/2018   GFRNONAA >60 04/18/2018    Hepatic Lab Results  Component Value Date   AST 22 04/18/2018   ALT 21 04/18/2018   ALBUMIN 3.6 04/18/2018   ALKPHOS 94 04/18/2018   LIPASE 33 04/18/2018    Electrolytes Lab Results  Component Value Date   NA 139 04/18/2018   K 3.6 04/18/2018   CL 106 04/18/2018   CALCIUM 8.7 (L) 04/18/2018    Bone No results found for: VD25OH, [id number], [id number], ML4650PT4, 25OHVITD1, 25OHVITD2, 25OHVITD3, TESTOFREE, TESTOSTERONE  Inflammation (CRP: Acute Phase) (ESR: Chronic Phase) Lab Results  Component Value Date   LATICACIDVEN 1.13 04/16/2018         Note: Above Lab results reviewed.  Recent Imaging Review  DG Wrist Complete Left CLINICAL DATA:  Left posterior wrist pain laceration following a fall today.  EXAM: LEFT WRIST - COMPLETE 3+  VIEW  COMPARISON:  None.  FINDINGS: Shallow linear soft tissue defect in the dorsal aspect of the wrist. Two small infused ossicles distal to the ulnar styloid. No acute fracture or dislocation.  IMPRESSION: No acute fracture or dislocation.  Electronically Signed   By: Claudie Revering M.D.   On: 04/12/2018 21:36 Note: Reviewed        Physical Exam  General appearance: Well nourished, well developed, and well hydrated. In no apparent acute distress Mental status: Alert, oriented x 3 (person, place, & time)        Respiratory: No evidence of acute respiratory distress Eyes: PERLA Vitals: BP (!) 143/91   Pulse (!) 112   Temp (!) 96.9 F (36.1 C) (Temporal)   Resp 15   Ht $R'5\' 2"'Oz$  (1.575 m)   Wt 182 lb (82.6 kg)   SpO2 99%   BMI 33.29 kg/m  BMI: Estimated body mass index is 33.29 kg/m as calculated from the following:   Height as of this encounter: $RemoveBeforeD'5\' 2"'VwPbXgwjiMPQIR$  (1.575 m).   Weight as of this encounter: 182 lb (82.6 kg). Ideal: Ideal body weight: 50.1 kg (110 lb 7.2 oz) Adjusted ideal body weight: 63.1 kg (139 lb 1.1 oz)  Cervical spine pain Limited cervical extension Surgical scar present Right trapezius pain 5 out of 5 strength bilateral upper extremity: Shoulder abduction, elbow flexion, elbow extension, thumb extension.   Assessment   Status Diagnosis  Controlled Controlled Having a Flare-up 1. Chronic pain syndrome   2. Hx of fusion of cervical spine   3. Myofascial pain syndrome   4. Neck pain   5. H/O cervical spine surgery ([facility] C5-C7 in [place])       Plan of Care    Ms. Verlean Allport has a current medication list which includes the following long-term medication(s): atorvastatin, diphenhydramine, escitalopram, and losartan.  Pharmacotherapy (Medications Ordered): Meds ordered this encounter  Medications   oxyCODONE-acetaminophen (PERCOCET) 5-325 MG tablet    Sig: Take 1 tablet by mouth every 12 (twelve) hours. Must last 30 days.    Dispense:  60 tablet    Refill:  0    Chronic Pain: STOP Act (Not applicable) Fill 1 day early if closed on refill date. Avoid benzodiazepines within 8 hours of opioids   ibuprofen (ADVIL) 800 MG tablet    Sig: Take 1 tablet (800 mg total) by mouth every 8 (eight) hours as needed for moderate pain.    Dispense:  60 tablet    Refill:  2   Orders:  No orders of the defined types were placed in this encounter.  Follow-up plan:   Return in about 3 months (around 05/24/2021) for Medication Management, in person.    Recent Visits Date  Type Provider Dept  12/03/20 Office Visit Gillis Santa, MD [facility]  Showing recent visits within past 90 days and meeting all other requirements Today's Visits Date Type Provider Dept  02/21/21 Office Visit Gillis Santa, MD [facility]  Showing today's visits and meeting all other requirements Future Appointments No visits were found meeting these conditions. Showing future appointments within next 90 days and meeting all other requirements I discussed the assessment and treatment plan with the patient. The patient was provided an opportunity to ask questions and all were answered. The patient agreed with the plan and demonstrated an understanding of the instructions.  Patient advised to call back or seek an in-person evaluation if the symptoms or condition worsens.  Duration of encounter: 71minutes.  Note by: Gillis Santa, MD Date: 02/21/2021; Time: 9:27 AM

## 2021-02-21 NOTE — Progress Notes (Signed)
Nursing Pain Medication Assessment:  Safety precautions to be maintained throughout the outpatient stay will include: orient to surroundings, keep bed in low position, maintain call bell within reach at all times, provide assistance with transfer out of bed and ambulation.  Medication Inspection Compliance: Pill count conducted under aseptic conditions, in front of the patient. Neither the pills nor the bottle was removed from the patient's sight at any time. Once count was completed pills were immediately returned to the patient in their original bottle.  Medication: Oxycodone/APAP Pill/Patch Count:  13.25 of 60 pills remain Pill/Patch Appearance: Markings consistent with prescribed medication Bottle Appearance: Standard pharmacy container. Clearly labeled. Filled Date: 08 / 01 / 2022 Last Medication intake:  Yesterday

## 2021-02-28 ENCOUNTER — Encounter: Payer: BC Managed Care – PPO | Admitting: Student in an Organized Health Care Education/Training Program

## 2021-04-15 ENCOUNTER — Telehealth: Payer: Self-pay | Admitting: Student in an Organized Health Care Education/Training Program

## 2021-04-15 MED ORDER — METHOCARBAMOL 500 MG PO TABS: 500.0000 mg | ORAL_TABLET | Freq: Three times a day (TID) | ORAL | 5 refills | Status: DC | PRN

## 2021-04-15 NOTE — Telephone Encounter (Signed)
Patient notified

## 2021-05-23 ENCOUNTER — Encounter: Payer: Self-pay | Admitting: Student in an Organized Health Care Education/Training Program

## 2021-05-23 ENCOUNTER — Other Ambulatory Visit: Payer: Self-pay

## 2021-05-23 ENCOUNTER — Ambulatory Visit
Payer: BC Managed Care – PPO | Attending: Student in an Organized Health Care Education/Training Program | Admitting: Student in an Organized Health Care Education/Training Program

## 2021-05-23 VITALS — BP 117/69 | HR 94 | Temp 97.0°F | Ht 62.0 in | Wt 175.0 lb

## 2021-05-23 DIAGNOSIS — Z981 Arthrodesis status: Secondary | ICD-10-CM

## 2021-05-23 DIAGNOSIS — M7918 Myalgia, other site: Secondary | ICD-10-CM

## 2021-05-23 DIAGNOSIS — G894 Chronic pain syndrome: Secondary | ICD-10-CM

## 2021-05-23 DIAGNOSIS — M542 Cervicalgia: Secondary | ICD-10-CM | POA: Diagnosis not present

## 2021-05-23 DIAGNOSIS — Z9889 Other specified postprocedural states: Secondary | ICD-10-CM | POA: Diagnosis present

## 2021-05-23 MED ORDER — METHOCARBAMOL 500 MG PO TABS: 500.0000 mg | ORAL_TABLET | Freq: Three times a day (TID) | ORAL | 5 refills | Status: DC | PRN

## 2021-05-23 MED ORDER — IBUPROFEN 800 MG PO TABS: 800.0000 mg | ORAL_TABLET | Freq: Three times a day (TID) | ORAL | 2 refills | Status: DC | PRN

## 2021-05-23 MED ORDER — OXYCODONE-ACETAMINOPHEN 5-325 MG PO TABS: 1.0000 | ORAL_TABLET | Freq: Two times a day (BID) | ORAL | 0 refills | Status: AC | PRN

## 2021-05-23 NOTE — Progress Notes (Signed)
Nursing Pain Medication Assessment:  Safety precautions to be maintained throughout the outpatient stay will include: orient to surroundings, keep bed in low position, maintain call bell within reach at all times, provide assistance with transfer out of bed and ambulation.  Medication Inspection Compliance: Pill count conducted under aseptic conditions, in front of the patient. Neither the pills nor the bottle was removed from the patient's sight at any time. Once count was completed pills were immediately returned to the patient in their original bottle.  Medication: Oxycodone/APAP Pill/Patch Count:  14 1/2 of 60 pills remain Pill/Patch Appearance: Markings consistent with prescribed medication Bottle Appearance: Standard pharmacy container. Clearly labeled. Filled Date: 15 / 1 / 2022 Last Medication intake:  TodaySafety precautions to be maintained throughout the outpatient stay will include: orient to surroundings, keep bed in low position, maintain call bell within reach at all times, provide assistance with transfer out of bed and ambulation.

## 2021-05-23 NOTE — Patient Instructions (Signed)
Talk to PCP about Ozempic for weight loss

## 2021-05-23 NOTE — Progress Notes (Signed)
PROVIDER NOTE: Information contained herein reflects review and annotations entered in association with encounter. Interpretation of such information and data should be left to medically-trained personnel. Information provided to patient can be located elsewhere in the medical record under "Patient Instructions". Document created using STT-dictation technology, any transcriptional errors that may result from process are unintentional.    Patient: Shannon Cortez  Service Category: E/M  Provider: Gillis Santa, MD  DOB: [birth year]  DOS: 05/23/2021  Specialty: Interventional Pain Management  MRN: [id number]  Setting: Ambulatory outpatient  PCP: Pcp, No  Type: Established Patient    Referring Provider: No ref. provider found  Location: Office  Delivery: Face-to-face     HPI  [name], a 56 y.o. year old female, is here today because of her Chronic pain syndrome [G89.4]. [name] primary complain today is Neck Pain  Last encounter: My last encounter with her was on 02/21/21  Pertinent problems: [name] has Cellulitis; Chronic pain syndrome; Hx of fusion of cervical spine; Neck pain; and Myofascial pain syndrome on their pertinent problem list. Pain Assessment: Severity of Chronic pain is reported as a 5 /10. Location: Neck Left, Right/pain radiaties down to her shoulder. Onset: More than a month ago. Quality: Aching, Burning, Shooting. Timing: Intermittent. Modifying factor(s): laying down, heat and meds. Vitals:  height is $RemoveB'5\' 2"'FBTepWKw$  (1.575 m) and weight is 175 lb (79.4 kg). Her temperature is 97 F (36.1 C) (abnormal). Her blood pressure is 117/69 and her pulse is 94. Her oxygen saturation is 99%.   Reason for encounter: medication management.   No change in medical history since last visit.  Patient's pain is at baseline.  Patient continues multimodal pain regimen as prescribed.  States that it provides pain relief and improvement in functional status. States that blood pressure  is better controlled after starting amlodipine and losartan.   Pharmacotherapy Assessment  Analgesic: Percocet 5 mg daily as needed, quantity 60 last 3 months    Monitoring: [place] PMP: PDMP reviewed during this encounter.       Pharmacotherapy: No side-effects or adverse reactions reported. Compliance: No problems identified. Effectiveness: Clinically acceptable.  Chauncey Fischer, RN  05/23/2021  8:56 AM  Sign when Signing Visit Nursing Pain Medication Assessment:  Safety precautions to be maintained throughout the outpatient stay will include: orient to surroundings, keep bed in low position, maintain call bell within reach at all times, provide assistance with transfer out of bed and ambulation.  Medication Inspection Compliance: Pill count conducted under aseptic conditions, in front of the patient. Neither the pills nor the bottle was removed from the patient's sight at any time. Once count was completed pills were immediately returned to the patient in their original bottle.  Medication: Oxycodone/APAP Pill/Patch Count:  14 1/2 of 60 pills remain Pill/Patch Appearance: Markings consistent with prescribed medication Bottle Appearance: Standard pharmacy container. Clearly labeled. Filled Date: 29 / 1 / 2022 Last Medication intake:  TodaySafety precautions to be maintained throughout the outpatient stay will include: orient to surroundings, keep bed in low position, maintain call bell within reach at all times, provide assistance with transfer out of bed and ambulation.      UDS:  Summary  Date Value Ref Range Status  11/01/2020 Note  Final    Comment:    ==================================================================== ToxASSURE Select 13 (MW) ==================================================================== Test  Result       Flag       Units  Drug Present not Declared for Prescription Verification   Noroxycodone                   148           UNEXPECTED ng/mg creat    Noroxycodone is an expected metabolite of oxycodone. Sources of    oxycodone include scheduled prescription medications.  ==================================================================== Test                      Result    Flag   Units      Ref Range   Creatinine              81               mg/dL      >=20 ==================================================================== Declared Medications:  The flagging and interpretation on this report are based on the  following declared medications.  Unexpected results may arise from  inaccuracies in the declared medications.   **Note: The testing scope of this panel does not include the  following reported medications:   Aspirin  Atorvastatin  Cannabidiol  Diphenhydramine (Benadryl)  Escitalopram (Lexapro)  Ibuprofen  Losartan  Methocarbamol ==================================================================== For clinical consultation, please call [phone]. ====================================================================      ROS  Constitutional: Denies any fever or chills Gastrointestinal: No reported hemesis, hematochezia, vomiting, or acute GI distress Musculoskeletal:  left t cervicalgia Neurological: No reported episodes of acute onset apraxia, aphasia, dysarthria, agnosia, amnesia, paralysis, loss of coordination, or loss of consciousness  Medication Review  AMBULATORY NON FORMULARY MEDICATION, NON FORMULARY, aspirin EC, atorvastatin, diphenhydrAMINE, escitalopram, ibuprofen, losartan, methocarbamol, and oxyCODONE-acetaminophen  History Review  Allergy: [name] is allergic to hyronan [sodium hyaluronate & lidocaine], toradol [ketorolac tromethamine], and sulfa antibiotics. Drug: [name]  reports no history of drug use. Alcohol:  reports no history of alcohol use. Tobacco:  reports that she quit smoking about 7 years ago. Her smoking use included cigarettes. She has never  used smokeless tobacco. Social: [name]  reports that she quit smoking about 7 years ago. Her smoking use included cigarettes. She has never used smokeless tobacco. She reports that she does not drink alcohol and does not use drugs. Medical:  has a past medical history of DDD (degenerative disc disease), lumbosacral, Hyperlipidemia, and Hypertension. Surgical: Ms. Kaufmann  has a past surgical history that includes Neck surgery; Abdominal hysterectomy; and Nasal reconstruction with septal repair. Family: family history includes Hepatitis in her mother.  Laboratory Chemistry Profile   Renal Lab Results  Component Value Date   BUN 13 04/18/2018   CREATININE 0.88 04/18/2018   GFRAA >60 04/18/2018   GFRNONAA >60 04/18/2018    Hepatic Lab Results  Component Value Date   AST 22 04/18/2018   ALT 21 04/18/2018   ALBUMIN 3.6 04/18/2018   ALKPHOS 94 04/18/2018   LIPASE 33 04/18/2018    Electrolytes Lab Results  Component Value Date   NA 139 04/18/2018   K 3.6 04/18/2018   CL 106 04/18/2018   CALCIUM 8.7 (L) 04/18/2018    Bone No results found for: VD25OH, [id number], HF2902XJ1, BZ2080EM3, 25OHVITD1, 25OHVITD2, 25OHVITD3, TESTOFREE, TESTOSTERONE  Inflammation (CRP: Acute Phase) (ESR: Chronic Phase) Lab Results  Component Value Date   LATICACIDVEN 1.13 04/16/2018         Note: Above Lab results reviewed.  Recent Imaging Review  DG Wrist Complete Left  CLINICAL DATA:  Left posterior wrist pain laceration following a fall today.  EXAM: LEFT WRIST - COMPLETE 3+ VIEW  COMPARISON:  None.  FINDINGS: Shallow linear soft tissue defect in the dorsal aspect of the wrist. Two small infused ossicles distal to the ulnar styloid. No acute fracture or dislocation.  IMPRESSION: No acute fracture or dislocation.  Electronically Signed   By: Claudie Revering M.D.   On: 04/12/2018 21:36  Note: Reviewed        Physical Exam  General appearance: Well nourished, well developed,  and well hydrated. In no apparent acute distress Mental status: Alert, oriented x 3 (person, place, & time)       Respiratory: No evidence of acute respiratory distress Eyes: PERLA Vitals: BP 117/69    Pulse 94    Temp (!) 97 F (36.1 C)    Ht 5\' 2"  (1.575 m)    Wt 175 lb (79.4 kg)    SpO2 99%    BMI 32.01 kg/m  BMI: Estimated body mass index is 32.01 kg/m as calculated from the following:   Height as of this encounter: 5\' 2"  (1.575 m).   Weight as of this encounter: 175 lb (79.4 kg). Ideal: Ideal body weight: 50.1 kg (110 lb 7.2 oz) Adjusted ideal body weight: 61.8 kg (136 lb 4.3 oz)  Cervical spine pain Limited cervical extension Surgical scar present 5 out of 5 strength bilateral upper extremity: Shoulder abduction, elbow flexion, elbow extension, thumb extension.   Assessment   Status Diagnosis  Controlled Controlled Controlled 1. Chronic pain syndrome   2. Hx of fusion of cervical spine   3. Myofascial pain syndrome   4. Neck pain   5. H/O cervical spine surgery ([facility] C5-C7 in [place])       Plan of Care    Ms. Wm Falkenhagen has a current medication list which includes the following long-term medication(s): atorvastatin, diphenhydramine, escitalopram, and losartan.  Pharmacotherapy (Medications Ordered): Meds ordered this encounter  Medications   ibuprofen (ADVIL) 800 MG tablet    Sig: Take 1 tablet (800 mg total) by mouth every 8 (eight) hours as needed for moderate pain.    Dispense:  60 tablet    Refill:  2   methocarbamol (ROBAXIN) 500 MG tablet    Sig: Take 1 tablet (500 mg total) by mouth every 8 (eight) hours as needed for muscle spasms.    Dispense:  90 tablet    Refill:  5    Do not place this medication, or any other prescription from our practice, on "Automatic Refill". Patient may have prescription filled one day early if pharmacy is closed on scheduled refill date.   oxyCODONE-acetaminophen (PERCOCET) 5-325 MG tablet    Sig: Take 1 tablet  by mouth 2 (two) times daily as needed for severe pain. Must last 30 days.    Dispense:  60 tablet    Refill:  0    Chronic Pain: STOP Act (Not applicable) Fill 1 day early if closed on refill date. Avoid benzodiazepines within 8 hours of opioids   Patient counseled on chronic NSAID use  Orders:  Orders Placed This Encounter  Procedures   ToxASSURE Select 13 (MW), Urine    Volume: 30 ml(s). Minimum 3 ml of urine is needed. Document temperature of fresh sample. Indications: Long term (current) use of opiate analgesic [id number])    Order Specific Question:   Release to patient    Answer:   Immediate    Follow-up plan:  Return in about 14 weeks (around 08/29/2021) for Medication Management, in person.    Recent Visits No visits were found meeting these conditions. Showing recent visits within past 90 days and meeting all other requirements Today's Visits Date Type Provider Dept  05/23/21 Office Visit Gillis Santa, MD [facility]  Showing today's visits and meeting all other requirements Future Appointments No visits were found meeting these conditions. Showing future appointments within next 90 days and meeting all other requirements  I discussed the assessment and treatment plan with the patient. The patient was provided an opportunity to ask questions and all were answered. The patient agreed with the plan and demonstrated an understanding of the instructions.  Patient advised to call back or seek an in-person evaluation if the symptoms or condition worsens.  Duration of encounter: 49minutes.  Note by: Gillis Santa, MD Date: 05/23/2021; Time: 9:06 AM

## 2021-05-29 LAB — TOXASSURE SELECT 13 (MW), URINE

## 2021-08-01 ENCOUNTER — Other Ambulatory Visit: Payer: Self-pay | Admitting: Student in an Organized Health Care Education/Training Program

## 2021-08-01 DIAGNOSIS — G894 Chronic pain syndrome: Secondary | ICD-10-CM

## 2021-08-20 ENCOUNTER — Encounter: Payer: Self-pay | Admitting: Student in an Organized Health Care Education/Training Program

## 2021-08-20 ENCOUNTER — Ambulatory Visit
Payer: BC Managed Care – PPO | Attending: Student in an Organized Health Care Education/Training Program | Admitting: Student in an Organized Health Care Education/Training Program

## 2021-08-20 VITALS — BP 113/79 | HR 89 | Temp 97.3°F | Resp 14 | Ht 62.0 in | Wt 178.0 lb

## 2021-08-20 DIAGNOSIS — M7918 Myalgia, other site: Secondary | ICD-10-CM | POA: Diagnosis not present

## 2021-08-20 DIAGNOSIS — Z981 Arthrodesis status: Secondary | ICD-10-CM | POA: Diagnosis not present

## 2021-08-20 DIAGNOSIS — G894 Chronic pain syndrome: Secondary | ICD-10-CM | POA: Diagnosis not present

## 2021-08-20 MED ORDER — OXYCODONE-ACETAMINOPHEN 5-325 MG PO TABS: 1.0000 | ORAL_TABLET | Freq: Two times a day (BID) | ORAL | 0 refills | Status: AC | PRN

## 2021-08-20 NOTE — Progress Notes (Signed)
PROVIDER NOTE: Information contained herein reflects review and annotations entered in association with encounter. Interpretation of such information and data should be left to medically-trained personnel. Information provided to patient can be located elsewhere in the medical record under "Patient Instructions". Document created using STT-dictation technology, any transcriptional errors that may result from process are unintentional.  ?  ?Patient: Shannon Cortez  Service Category: E/M  Provider: Gillis Santa, MD  ?DOB: 10-23-1965  DOS: 08/20/2021  Specialty: Interventional Pain Management  ?MRN: [id number]  Setting: Ambulatory outpatient  PCP: Pcp, No  ?Type: Established Patient    Referring Provider: No ref. provider found  ?Location: Office  Delivery: Face-to-face    ? ?HPI  ?Ms. Taylin Mans, a 56 y.o. year old female, is here today because of her Chronic pain syndrome [G89.4]. Ms. Masih primary complain today is Neck Pain (left) ? ?Last encounter: My last encounter with her was on 05/23/21 ? ?Pertinent problems: Ms. Stennis has Cellulitis; Chronic pain syndrome; Hx of fusion of cervical spine; Neck pain; and Myofascial pain syndrome on their pertinent problem list. ?Pain Assessment: Severity of Chronic pain is reported as a 4 /10. Location: Neck Left/left shoulder, with numbness down arm. Onset: More than a month ago. Quality: Burning, Stabbing. Timing: Intermittent. Modifying factor(s): muscle rub, heating pad, stretching, meds. ?Vitals:  height is $RemoveB'5\' 2"'mjEvUZcN$  (1.575 m) and weight is 178 lb (80.7 kg). Her temporal temperature is 97.3 ?F (36.3 ?C) (abnormal). Her blood pressure is 113/79 and her pulse is 89. Her respiration is 14 and oxygen saturation is 100%.  ? ?Reason for encounter: medication management.  ? ?No change in medical history since last visit.  Patient's pain is at baseline.  Patient continues multimodal pain regimen as prescribed.  States that it provides pain relief and improvement in  functional status. ? ? ? ?Pharmacotherapy Assessment  ?Analgesic: Percocet 5 mg daily as needed, quantity 60 last 3 months   ? ?Monitoring: ?[place] PMP: PDMP reviewed during this encounter.       ?Pharmacotherapy: No side-effects or adverse reactions reported. ?Compliance: No problems identified. ?Effectiveness: Clinically acceptable. ? ?Landis Martins, RN  08/20/2021  8:16 AM  Sign when Signing Visit ?Nursing Pain Medication Assessment:  ?Safety precautions to be maintained throughout the outpatient stay will include: orient to surroundings, keep bed in low position, maintain call bell within reach at all times, provide assistance with transfer out of bed and ambulation.  ?Medication Inspection Compliance: Pill count conducted under aseptic conditions, in front of the patient. Neither the pills nor the bottle was removed from the patient's sight at any time. Once count was completed pills were immediately returned to the patient in their original bottle. ? ?Medication: Oxycodone/APAP ?Pill/Patch Count:  5 of 325 pills remain ?Pill/Patch Appearance: Markings consistent with prescribed medication ?Bottle Appearance: Standard pharmacy container. Clearly labeled. ?Filled Date: 02 / 01 / 2023 ?Last Medication intake:  Yesterday ?  UDS:  ?Summary  ?Date Value Ref Range Status  ?05/23/2021 Note  Final  ?  Comment:  ?  ==================================================================== ?ToxASSURE Select 13 (MW) ?==================================================================== ?Test                             Result       Flag       Units ? ?Drug Present and Declared for Prescription Verification ?  Oxycodone  99           EXPECTED   ng/mg creat ?  Noroxycodone                   292          EXPECTED   ng/mg creat ?   Sources of oxycodone include scheduled prescription medications. ?   Noroxycodone is an expected metabolite of  oxycodone. ? ?==================================================================== ?Test                      Result    Flag   Units      Ref Range ?  Creatinine              115              mg/dL      >=20 ?==================================================================== ?Declared Medications: ? The flagging and interpretation on this report are based on the ? following declared medications.  Unexpected results may arise from ? inaccuracies in the declared medications. ? ? **Note: The testing scope of this panel includes these medications: ? ? Oxycodone (Percocet) ? ? **Note: The testing scope of this panel does not include the ? following reported medications: ? ? Acetaminophen ? Acetaminophen (Percocet) ? Aspirin ? Atorvastatin ? Cannabidiol ? Dextromethorphan ? Diphenhydramine (Benadryl) ? Doxycycline ? Escitalopram (Lexapro) ? Ibuprofen (Advil) ? Losartan (Cozaar) ? Methocarbamol ? Phenylephrine ? Topical ?==================================================================== ?For clinical consultation, please call [phone]. ?==================================================================== ?  ?  ? ?ROS  ?Constitutional: Denies any fever or chills ?Gastrointestinal: No reported hemesis, hematochezia, vomiting, or acute GI distress ?Musculoskeletal:  left cervicalgia ?Neurological: No reported episodes of acute onset apraxia, aphasia, dysarthria, agnosia, amnesia, paralysis, loss of coordination, or loss of consciousness ? ?Medication Review  ?AMBULATORY NON FORMULARY MEDICATION, NON FORMULARY, albuterol, aspirin EC, atorvastatin, diphenhydrAMINE, escitalopram, ibuprofen, losartan, methocarbamol, oxyCODONE-acetaminophen, and traZODone ? ?History Review  ?Allergy: Ms. Hoar is allergic to hyronan [sodium hyaluronate & lidocaine], toradol [ketorolac tromethamine], and sulfa antibiotics. ?Drug: Ms. Vaquerano  reports no history of drug use. ?Alcohol:  reports no history of alcohol use. ?Tobacco:   reports that she quit smoking about 7 years ago. Her smoking use included cigarettes. She has never used smokeless tobacco. ?Social: Ms. Youtz  reports that she quit smoking about 7 years ago. Her smoking use included cigarettes. She has never used smokeless tobacco. She reports that she does not drink alcohol and does not use drugs. ?Medical:  has a past medical history of DDD (degenerative disc disease), lumbosacral, Hyperlipidemia, and Hypertension. ?Surgical: Ms. Paladino  has a past surgical history that includes Neck surgery; Abdominal hysterectomy; and Nasal reconstruction with septal repair. ?Family: family history includes Hepatitis in her mother. ? ?Laboratory Chemistry Profile  ? ?Renal ?Lab Results  ?Component Value Date  ? BUN 13 04/18/2018  ? CREATININE 0.88 04/18/2018  ? GFRAA >60 04/18/2018  ? GFRNONAA >60 04/18/2018  ?  Hepatic ?Lab Results  ?Component Value Date  ? AST 22 04/18/2018  ? ALT 21 04/18/2018  ? ALBUMIN 3.6 04/18/2018  ? ALKPHOS 94 04/18/2018  ? LIPASE 33 04/18/2018  ?  ?Electrolytes ?Lab Results  ?Component Value Date  ? NA 139 04/18/2018  ? K 3.6 04/18/2018  ? CL 106 04/18/2018  ? CALCIUM 8.7 (L) 04/18/2018  ?  Bone ?No results found for: [place], [id number], [id number], [id number], 25OHVITD1, 25OHVITD2, 25OHVITD3, TESTOFREE, TESTOSTERONE  ?Inflammation (CRP: Acute Phase) (ESR: Chronic Phase) ?Lab Results  ?Component Value Date  ?  LATICACIDVEN 1.13 04/16/2018  ?    ?  ? ?Note: Above Lab results reviewed. ? ?Recent Imaging Review  ?DG Wrist Complete Left ?CLINICAL DATA:  Left posterior wrist pain laceration following a ?fall today. ? ?EXAM: ?LEFT WRIST - COMPLETE 3+ VIEW ? ?COMPARISON:  None. ? ?FINDINGS: ?Shallow linear soft tissue defect in the dorsal aspect of the wrist. ?Two small infused ossicles distal to the ulnar styloid. No acute ?fracture or dislocation. ? ?IMPRESSION: ?No acute fracture or dislocation. ? ?Electronically Signed ?  By: Claudie Revering M.D. ?  On: 04/12/2018  21:36 ? ?Note: Reviewed       ? ?Physical Exam  ?General appearance: Well nourished, well developed, and well hydrated. In no apparent acute distress ?Mental status: Alert, oriented x 3 (person, place, & time)       ?Respiratory: No evidence of acute respiratory distress ?Eyes: PERLA ?

## 2021-08-20 NOTE — Progress Notes (Signed)
Nursing Pain Medication Assessment:  ?Safety precautions to be maintained throughout the outpatient stay will include: orient to surroundings, keep bed in low position, maintain call bell within reach at all times, provide assistance with transfer out of bed and ambulation.  ?Medication Inspection Compliance: Pill count conducted under aseptic conditions, in front of the patient. Neither the pills nor the bottle was removed from the patient's sight at any time. Once count was completed pills were immediately returned to the patient in their original bottle. ? ?Medication: Oxycodone/APAP ?Pill/Patch Count:  5 of 325 pills remain ?Pill/Patch Appearance: Markings consistent with prescribed medication ?Bottle Appearance: Standard pharmacy container. Clearly labeled. ?Filled Date: 02 / 01 / 2023 ?Last Medication intake:  Yesterday ?

## 2021-08-22 ENCOUNTER — Encounter: Payer: BC Managed Care – PPO | Admitting: Student in an Organized Health Care Education/Training Program

## 2021-11-12 ENCOUNTER — Encounter: Payer: Self-pay | Admitting: Student in an Organized Health Care Education/Training Program

## 2021-11-12 ENCOUNTER — Ambulatory Visit
Payer: BC Managed Care – PPO | Attending: Student in an Organized Health Care Education/Training Program | Admitting: Student in an Organized Health Care Education/Training Program

## 2021-11-12 VITALS — BP 128/68 | HR 91 | Temp 97.6°F | Resp 16 | Ht 62.0 in | Wt 182.0 lb

## 2021-11-12 DIAGNOSIS — M542 Cervicalgia: Secondary | ICD-10-CM | POA: Diagnosis present

## 2021-11-12 DIAGNOSIS — M7918 Myalgia, other site: Secondary | ICD-10-CM | POA: Diagnosis present

## 2021-11-12 DIAGNOSIS — G894 Chronic pain syndrome: Secondary | ICD-10-CM

## 2021-11-12 DIAGNOSIS — Z981 Arthrodesis status: Secondary | ICD-10-CM | POA: Diagnosis present

## 2021-11-12 MED ORDER — OXYCODONE-ACETAMINOPHEN 5-325 MG PO TABS: 1.0000 | ORAL_TABLET | Freq: Two times a day (BID) | ORAL | 0 refills | Status: AC

## 2021-11-12 MED ORDER — METHOCARBAMOL 500 MG PO TABS: 500.0000 mg | ORAL_TABLET | Freq: Three times a day (TID) | ORAL | 5 refills | Status: DC | PRN

## 2021-11-12 NOTE — Progress Notes (Signed)
Nursing Pain Medication Assessment:  Safety precautions to be maintained throughout the outpatient stay will include: orient to surroundings, keep bed in low position, maintain call bell within reach at all times, provide assistance with transfer out of bed and ambulation.  Medication Inspection Compliance: Pill count conducted under aseptic conditions, in front of the patient. Neither the pills nor the bottle was removed from the patient's sight at any time. Once count was completed pills were immediately returned to the patient in their original bottle.  Medication: Oxycodone/APAP Pill/Patch Count:  12.5 of 60 pills remain Pill/Patch Appearance: Markings consistent with prescribed medication Bottle Appearance: Standard pharmacy container. Clearly labeled. Filled Date: 04 / 18 / 2023 Last Medication intake:  Yesterday

## 2021-11-12 NOTE — Progress Notes (Signed)
PROVIDER NOTE: Information contained herein reflects review and annotations entered in association with encounter. Interpretation of such information and data should be left to medically-trained personnel. Information provided to patient can be located elsewhere in the medical record under "Patient Instructions". Document created using STT-dictation technology, any transcriptional errors that may result from process are unintentional.    Patient: Shannon Cortez  Service Category: E/M  Provider: Gillis Santa, MD  DOB: 08/17/1965  DOS: 11/12/2021  Specialty: Interventional Pain Management  MRN: [id number]  Setting: Ambulatory outpatient  PCP: Pcp, No  Type: Established Patient    Referring Provider: No ref. provider found  Location: Office  Delivery: Face-to-face     HPI  Shannon Cortez, a 56 y.o. year old female, is here today because of her Chronic pain syndrome [G89.4]. Shannon Cortez primary complain today is Neck Pain, Shoulder Pain, and Back Pain (lower)  Last encounter: My last encounter with her was on 08/20/21  Pertinent problems: Shannon Cortez has Cellulitis; Chronic pain syndrome; Hx of fusion of cervical spine; Neck pain; and Myofascial pain syndrome on their pertinent problem list. Pain Assessment: Severity of Chronic pain is reported as a 4 /10. Location: Neck  /to lefdt shoulder. Onset: More than a month ago. Quality: Aching, Burning, Throbbing. Timing: Constant. Modifying factor(s): meds. Vitals:  height is $RemoveB'5\' 2"'kcSLaGlY$  (1.575 m) and weight is 182 lb (82.6 kg). Her temporal temperature is 97.6 F (36.4 C). Her blood pressure is 128/68 and her pulse is 91. Her respiration is 16 and oxygen saturation is 99%.   Reason for encounter: medication management.   No change in medical history since last visit.  Patient's pain is at baseline.  Patient continues multimodal pain regimen as prescribed.  States that it provides pain relief and improvement in functional status.    Pharmacotherapy  Assessment  Analgesic: Percocet 5 mg daily as needed, quantity 60 last 3 months    Monitoring: [place] PMP: PDMP not reviewed this encounter.       Pharmacotherapy: No side-effects or adverse reactions reported. Compliance: No problems identified. Effectiveness: Clinically acceptable.  Rise Patience, RN  11/12/2021  8:12 AM  Sign when Signing Visit Nursing Pain Medication Assessment:  Safety precautions to be maintained throughout the outpatient stay will include: orient to surroundings, keep bed in low position, maintain call bell within reach at all times, provide assistance with transfer out of bed and ambulation.  Medication Inspection Compliance: Pill count conducted under aseptic conditions, in front of the patient. Neither the pills nor the bottle was removed from the patient's sight at any time. Once count was completed pills were immediately returned to the patient in their original bottle.  Medication: Oxycodone/APAP Pill/Patch Count:  12.5 of 60 pills remain Pill/Patch Appearance: Markings consistent with prescribed medication Bottle Appearance: Standard pharmacy container. Clearly labeled. Filled Date: 04 / 18 / 2023 Last Medication intake:  Yesterday   UDS:  Summary  Date Value Ref Range Status  05/23/2021 Note  Final    Comment:    ==================================================================== [employer] Select 13 (MW) ==================================================================== Test                             Result       Flag       Units  Drug Present and Declared for Prescription Verification   Oxycodone                      99  EXPECTED   ng/mg creat   Noroxycodone                   292          EXPECTED   ng/mg creat    Sources of oxycodone include scheduled prescription medications.    Noroxycodone is an expected metabolite of oxycodone.  ==================================================================== Test                      Result     Flag   Units      Ref Range   Creatinine              115              mg/dL      >=20 ==================================================================== Declared Medications:  The flagging and interpretation on this report are based on the  following declared medications.  Unexpected results may arise from  inaccuracies in the declared medications.   **Note: The testing scope of this panel includes these medications:   Oxycodone (Percocet)   **Note: The testing scope of this panel does not include the  following reported medications:   Acetaminophen  Acetaminophen (Percocet)  Aspirin  Atorvastatin  Cannabidiol  Dextromethorphan  Diphenhydramine (Benadryl)  Doxycycline  Escitalopram (Lexapro)  Ibuprofen (Advil)  Losartan (Cozaar)  Methocarbamol  Phenylephrine  Topical ==================================================================== For clinical consultation, please call [phone]. ====================================================================      ROS  Constitutional: Denies any fever or chills Gastrointestinal: No reported hemesis, hematochezia, vomiting, or acute GI distress Musculoskeletal:  cervicalgia Neurological: No reported episodes of acute onset apraxia, aphasia, dysarthria, agnosia, amnesia, paralysis, loss of coordination, or loss of consciousness  Medication Review  AMBULATORY NON FORMULARY MEDICATION, NON FORMULARY, albuterol, aspirin EC, atorvastatin, diphenhydrAMINE, escitalopram, ibuprofen, losartan, methocarbamol, oxyCODONE-acetaminophen, and traZODone  History Review  Allergy: Shannon Cortez is allergic to hyronan [sodium hyaluronate & lidocaine], toradol [ketorolac tromethamine], and sulfa antibiotics. Drug: Shannon Cortez  reports no history of drug use. Alcohol:  reports no history of alcohol use. Tobacco:  reports that she quit smoking about 7 years ago. Her smoking use included cigarettes. She has never used smokeless  tobacco. Social: Shannon Cortez  reports that she quit smoking about 7 years ago. Her smoking use included cigarettes. She has never used smokeless tobacco. She reports that she does not drink alcohol and does not use drugs. Medical:  has a past medical history of DDD (degenerative disc disease), lumbosacral, Hyperlipidemia, and Hypertension. Surgical: Shannon Cortez  has a past surgical history that includes Neck surgery; Abdominal hysterectomy; and Nasal reconstruction with septal repair. Family: family history includes Hepatitis in her mother.  Laboratory Chemistry Profile   Renal Lab Results  Component Value Date   BUN 13 04/18/2018   CREATININE 0.88 04/18/2018   GFRAA >60 04/18/2018   GFRNONAA >60 04/18/2018    Hepatic Lab Results  Component Value Date   AST 22 04/18/2018   ALT 21 04/18/2018   ALBUMIN 3.6 04/18/2018   ALKPHOS 94 04/18/2018   LIPASE 33 04/18/2018    Electrolytes Lab Results  Component Value Date   NA 139 04/18/2018   K 3.6 04/18/2018   CL 106 04/18/2018   CALCIUM 8.7 (L) 04/18/2018    Bone No results found for: "VD25OH", "VD125OH2TOT", "XB9390ZE0", "PQ3300TM2", "25OHVITD1", "25OHVITD2", "26JFHLKT6", "TESTOFREE", "TESTOSTERONE"  Inflammation (CRP: Acute Phase) (ESR: Chronic Phase) Lab Results  Component Value Date   LATICACIDVEN 1.13 04/16/2018  Note: Above Lab results reviewed.  Recent Imaging Review  DG Wrist Complete Left CLINICAL DATA:  Left posterior wrist pain laceration following a fall today.  EXAM: LEFT WRIST - COMPLETE 3+ VIEW  COMPARISON:  None.  FINDINGS: Shallow linear soft tissue defect in the dorsal aspect of the wrist. Two small infused ossicles distal to the ulnar styloid. No acute fracture or dislocation.  IMPRESSION: No acute fracture or dislocation.  Electronically Signed   By: Claudie Revering M.D.   On: 04/12/2018 21:36  Note: Reviewed        Physical Exam  General appearance: Well nourished, well developed,  and well hydrated. In no apparent acute distress Mental status: Alert, oriented x 3 (person, place, & time)       Respiratory: No evidence of acute respiratory distress Eyes: PERLA Vitals: BP 128/68   Pulse 91   Temp 97.6 F (36.4 C) (Temporal)   Resp 16   Ht $R'5\' 2"'Nd$  (1.575 m)   Wt 182 lb (82.6 kg)   SpO2 99%   BMI 33.29 kg/m  BMI: Estimated body mass index is 33.29 kg/m as calculated from the following:   Height as of this encounter: $RemoveBeforeD'5\' 2"'jmUHLskTcjMKdh$  (1.575 m).   Weight as of this encounter: 182 lb (82.6 kg). Ideal: Ideal body weight: 50.1 kg (110 lb 7.2 oz) Adjusted ideal body weight: 63.1 kg (139 lb 1.1 oz)  Cervical spine pain Limited cervical extension Surgical scar present 5 out of 5 strength bilateral upper extremity: Shoulder abduction, elbow flexion, elbow extension, thumb extension.   Assessment   Status Diagnosis  Controlled Controlled Controlled 1. Chronic pain syndrome   2. Hx of fusion of cervical spine   3. Neck pain   4. Myofascial pain syndrome       Plan of Care    Shannon Cortez has a current medication list which includes the following long-term medication(s): albuterol, atorvastatin, diphenhydramine, escitalopram, losartan, and trazodone.  Pharmacotherapy (Medications Ordered): Meds ordered this encounter  Medications   oxyCODONE-acetaminophen (PERCOCET/ROXICET) 5-325 MG tablet    Sig: Take 1 tablet by mouth 2 times daily at 12 noon and 4 pm.    Dispense:  60 tablet    Refill:  0   methocarbamol (ROBAXIN) 500 MG tablet    Sig: Take 1 tablet (500 mg total) by mouth every 8 (eight) hours as needed for muscle spasms.    Dispense:  90 tablet    Refill:  5    Do not place this medication, or any other prescription from our practice, on "Automatic Refill". Patient may have prescription filled one day early if pharmacy is closed on scheduled refill date.    Orders:  No orders of the defined types were placed in this encounter.   Follow-up plan:    Return in about 3 months (around 02/12/2022) for Medication Management, in person.    Recent Visits Date Type Provider Dept  08/20/21 Office Visit Gillis Santa, MD [facility]  Showing recent visits within past 90 days and meeting all other requirements Today's Visits Date Type Provider Dept  11/12/21 Office Visit Gillis Santa, MD [facility]  Showing today's visits and meeting all other requirements Future Appointments Date Type Provider Dept  02/04/22 Appointment Gillis Santa, MD [facility]  Showing future appointments within next 90 days and meeting all other requirements  I discussed the assessment and treatment plan with the patient. The patient was provided an opportunity to ask questions and all were answered.  The patient agreed with the plan and demonstrated an understanding of the instructions.  Patient advised to call back or seek an in-person evaluation if the symptoms or condition worsens.  Duration of encounter: 12minutes.  Note by: Gillis Santa, MD Date: 11/12/2021; Time: 10:15 AM

## 2021-11-13 ENCOUNTER — Telehealth: Payer: Self-pay | Admitting: Student in an Organized Health Care Education/Training Program

## 2021-11-13 NOTE — Telephone Encounter (Signed)
Patient stated that [facility] needs autho before patient can pick up refill meds. Please give patient a call. Thanks

## 2021-11-13 NOTE — Telephone Encounter (Signed)
Patient aware.

## 2021-12-26 LAB — COLOGUARD: COLOGUARD: NEGATIVE

## 2022-02-04 ENCOUNTER — Ambulatory Visit
Payer: BC Managed Care – PPO | Attending: Student in an Organized Health Care Education/Training Program | Admitting: Student in an Organized Health Care Education/Training Program

## 2022-02-04 ENCOUNTER — Encounter: Payer: Self-pay | Admitting: Student in an Organized Health Care Education/Training Program

## 2022-02-04 VITALS — BP 121/71 | HR 95 | Temp 97.2°F | Ht 63.0 in | Wt 187.0 lb

## 2022-02-04 DIAGNOSIS — M542 Cervicalgia: Secondary | ICD-10-CM

## 2022-02-04 DIAGNOSIS — M7918 Myalgia, other site: Secondary | ICD-10-CM | POA: Diagnosis present

## 2022-02-04 DIAGNOSIS — Z9889 Other specified postprocedural states: Secondary | ICD-10-CM | POA: Diagnosis present

## 2022-02-04 DIAGNOSIS — Z981 Arthrodesis status: Secondary | ICD-10-CM | POA: Diagnosis present

## 2022-02-04 DIAGNOSIS — G894 Chronic pain syndrome: Secondary | ICD-10-CM

## 2022-02-04 MED ORDER — OXYCODONE-ACETAMINOPHEN 5-325 MG PO TABS: 1.0000 | ORAL_TABLET | Freq: Two times a day (BID) | ORAL | 0 refills | Status: AC | PRN

## 2022-02-04 NOTE — Progress Notes (Signed)
PROVIDER NOTE: Information contained herein reflects review and annotations entered in association with encounter. Interpretation of such information and data should be left to medically-trained personnel. Information provided to patient can be located elsewhere in the medical record under "Patient Instructions". Document created using STT-dictation technology, any transcriptional errors that may result from process are unintentional.    Patient: Shannon Cortez  Service Category: E/M  Provider: Gillis Santa, MD  DOB: [birth year]  DOS: 02/04/2022  Specialty: Interventional Pain Management  MRN: [id number]  Setting: Ambulatory outpatient  PCP: Pcp, No  Type: Established Patient    Referring Provider: No ref. provider found  Location: Office  Delivery: Face-to-face     HPI  Shannon Cortez, a 56 y.o. year old female, is here today because of her Chronic pain syndrome [G89.4]. Shannon Cortez primary complain today is Shoulder Pain (left)  Last encounter: My last encounter with her was on 11/12/21  Pertinent problems: Shannon Cortez has Cellulitis; Chronic pain syndrome; Hx of fusion of cervical spine; Neck pain; and Myofascial pain syndrome on their pertinent problem list. Pain Assessment: Severity of Chronic pain is reported as a 4 /10. Location: Shoulder Left/Denies. Onset: More than a month ago. Quality: Constant, Aching, Burning, Stabbing, Throbbing, Nagging. Timing: Constant. Modifying factor(s): Meds and heat. Vitals:  height is _0  (1.6 m) and weight is 187 lb (84.8 kg). Her temperature is 97.2 F (36.2 C) (abnormal). Her blood pressure is 121/71 and her pulse is 95. Her oxygen saturation is 100%.   Reason for encounter: medication management.   No change in medical history since last visit.  Patient's pain is at baseline.  Patient continues multimodal pain regimen as prescribed.  States that it provides pain relief and improvement in functional status.    Pharmacotherapy Assessment   Analgesic: Percocet 5 mg daily as needed, quantity 60 last 3 months    Monitoring: [place] PMP: PDMP reviewed during this encounter.       Pharmacotherapy: No side-effects or adverse reactions reported. Compliance: No problems identified. Effectiveness: Clinically acceptable.  Chauncey Fischer, RN  02/04/2022  8:14 AM  Sign when Signing Visit Nursing Pain Medication Assessment:  Safety precautions to be maintained throughout the outpatient stay will include: orient to surroundings, keep bed in low position, maintain call bell within reach at all times, provide assistance with transfer out of bed and ambulation.  Medication Inspection Compliance: Pill count conducted under aseptic conditions, in front of the patient. Neither the pills nor the bottle was removed from the patient's sight at any time. Once count was completed pills were immediately returned to the patient in their original bottle.  Medication: Oxycodone/APAP Pill/Patch Count:  18 of 60 pills remain Pill/Patch Appearance: Markings consistent with prescribed medication Bottle Appearance: Standard pharmacy container. Clearly labeled. Filled Date: 7 / 1 / 2023 Last Medication intake:  TodaySafety precautions to be maintained throughout the outpatient stay will include: orient to surroundings, keep bed in low position, maintain call bell within reach at all times, provide assistance with transfer out of bed and ambulation.      UDS:  Summary  Date Value Ref Range Status  05/23/2021 Note  Final    Comment:    ==================================================================== [employer] Select 13 (MW) ==================================================================== Test                             Result       Flag       Units  Drug Present and Declared for Prescription Verification   Oxycodone                      99           EXPECTED   ng/mg creat   Noroxycodone                   292          EXPECTED   ng/mg creat     Sources of oxycodone include scheduled prescription medications.    Noroxycodone is an expected metabolite of oxycodone.  ==================================================================== Test                      Result    Flag   Units      Ref Range   Creatinine              115              mg/dL      >=20 ==================================================================== Declared Medications:  The flagging and interpretation on this report are based on the  following declared medications.  Unexpected results may arise from  inaccuracies in the declared medications.   **Note: The testing scope of this panel includes these medications:   Oxycodone (Percocet)   **Note: The testing scope of this panel does not include the  following reported medications:   Acetaminophen  Acetaminophen (Percocet)  Aspirin  Atorvastatin  Cannabidiol  Dextromethorphan  Diphenhydramine (Benadryl)  Doxycycline  Escitalopram (Lexapro)  Ibuprofen (Advil)  Losartan (Cozaar)  Methocarbamol  Phenylephrine  Topical ==================================================================== For clinical consultation, please call [phone]. ====================================================================      ROS  Constitutional: Denies any fever or chills Gastrointestinal: No reported hemesis, hematochezia, vomiting, or acute GI distress Musculoskeletal:  cervicalgia Neurological: No reported episodes of acute onset apraxia, aphasia, dysarthria, agnosia, amnesia, paralysis, loss of coordination, or loss of consciousness  Medication Review  AMBULATORY NON FORMULARY MEDICATION, NON FORMULARY, albuterol, aspirin EC, atorvastatin, diphenhydrAMINE, escitalopram, ibuprofen, losartan, methocarbamol, oxyCODONE-acetaminophen, and traZODone  History Review  Allergy: Shannon Cortez is allergic to hyronan [sodium hyaluronate & lidocaine], toradol [ketorolac tromethamine], and sulfa antibiotics. Drug:  Shannon Cortez  reports no history of drug use. Alcohol:  reports no history of alcohol use. Tobacco:  reports that she quit smoking about 7 years ago. Her smoking use included cigarettes. She has never used smokeless tobacco. Social: Shannon Cortez  reports that she quit smoking about 7 years ago. Her smoking use included cigarettes. She has never used smokeless tobacco. She reports that she does not drink alcohol and does not use drugs. Medical:  has a past medical history of DDD (degenerative disc disease), lumbosacral, Hyperlipidemia, and Hypertension. Surgical: Shannon Cortez  has a past surgical history that includes Neck surgery; Abdominal hysterectomy; and Nasal reconstruction with septal repair. Family: family history includes Hepatitis in her mother.  Laboratory Chemistry Profile   Renal Lab Results  Component Value Date   BUN 13 04/18/2018   CREATININE 0.88 04/18/2018   GFRAA >60 04/18/2018   GFRNONAA >60 04/18/2018    Hepatic Lab Results  Component Value Date   AST 22 04/18/2018   ALT 21 04/18/2018   ALBUMIN 3.6 04/18/2018   ALKPHOS 94 04/18/2018   LIPASE 33 04/18/2018    Electrolytes Lab Results  Component Value Date   NA 139 04/18/2018   K 3.6 04/18/2018   CL 106 04/18/2018   CALCIUM 8.7 (L) 04/18/2018  Bone No results found for: "VD25OH", "VD125OH2TOT", "GU5427CW2", "BJ6283TD1", "25OHVITD1", "25OHVITD2", "76HYWVPX1", "TESTOFREE", "TESTOSTERONE"  Inflammation (CRP: Acute Phase) (ESR: Chronic Phase) Lab Results  Component Value Date   LATICACIDVEN 1.13 04/16/2018         Note: Above Lab results reviewed.  Recent Imaging Review  DG Wrist Complete Left CLINICAL DATA:  Left posterior wrist pain laceration following a fall today.  EXAM: LEFT WRIST - COMPLETE 3+ VIEW  COMPARISON:  None.  FINDINGS: Shallow linear soft tissue defect in the dorsal aspect of the wrist. Two small infused ossicles distal to the ulnar styloid. No acute fracture or  dislocation.  IMPRESSION: No acute fracture or dislocation.  Electronically Signed   By: Claudie Revering M.D.   On: 04/12/2018 21:36  Note: Reviewed        Physical Exam  General appearance: Well nourished, well developed, and well hydrated. In no apparent acute distress Mental status: Alert, oriented x 3 (person, place, & time)       Respiratory: No evidence of acute respiratory distress Eyes: PERLA Vitals: BP 121/71   Pulse 95   Temp (!) 97.2 F (36.2 C)   Ht _0  (1.6 m)   Wt 187 lb (84.8 kg)   SpO2 100%   BMI 33.13 kg/m  BMI: Estimated body mass index is 33.13 kg/m as calculated from the following:   Height as of this encounter: _1  (1.6 m).   Weight as of this encounter: 187 lb (84.8 kg). Ideal: Ideal body weight: 52.4 kg (115 lb 8.3 oz) Adjusted ideal body weight: 65.4 kg (144 lb 1.8 oz)  Cervical spine pain Limited cervical extension Surgical scar present 5 out of 5 strength bilateral upper extremity: Shoulder abduction, elbow flexion, elbow extension, thumb extension.   Assessment   Status Diagnosis  Controlled Controlled Controlled 1. Chronic pain syndrome   2. Hx of fusion of cervical spine   3. Neck pain   4. Myofascial pain syndrome   5. H/O cervical spine surgery ([facility] C5-C7 in [place])       Plan of Care    Shannon Cortez has a current medication list which includes the following long-term medication(s): albuterol, atorvastatin, diphenhydramine, escitalopram, losartan, and trazodone.  Pharmacotherapy (Medications Ordered): Meds ordered this encounter  Medications   oxyCODONE-acetaminophen (PERCOCET) 5-325 MG tablet    Sig: Take 1 tablet by mouth every 12 (twelve) hours as needed for severe pain. Must last 30 days.    Dispense:  60 tablet    Refill:  0    Chronic Pain: STOP Act (Not applicable) Fill 1 day early if closed on refill date. Avoid benzodiazepines within 8 hours of opioids  Continue with Robaxin prn and psych  management   Orders:  No orders of the defined types were placed in this encounter.   Follow-up plan:   Return in about 14 weeks (around 05/13/2022) for Medication Management, in person.    Recent Visits Date Type Provider Dept  11/12/21 Office Visit Gillis Santa, MD [facility]  Showing recent visits within past 90 days and meeting all other requirements Today's Visits Date Type Provider Dept  02/04/22 Office Visit Gillis Santa, MD [facility]  Showing today's visits and meeting all other requirements Future Appointments No visits were found meeting these conditions. Showing future appointments within next 90 days and meeting all other requirements  I discussed the assessment and treatment plan with the patient. The patient was provided an opportunity to ask questions and  all were answered. The patient agreed with the plan and demonstrated an understanding of the instructions.  Patient advised to call back or seek an in-person evaluation if the symptoms or condition worsens.  Duration of encounter: 47mnutes.  Note by: BGillis Santa MD Date: 02/04/2022; Time: 8:28 AM

## 2022-02-04 NOTE — Progress Notes (Signed)
Nursing Pain Medication Assessment:  Safety precautions to be maintained throughout the outpatient stay will include: orient to surroundings, keep bed in low position, maintain call bell within reach at all times, provide assistance with transfer out of bed and ambulation.  Medication Inspection Compliance: Pill count conducted under aseptic conditions, in front of the patient. Neither the pills nor the bottle was removed from the patient's sight at any time. Once count was completed pills were immediately returned to the patient in their original bottle.  Medication: Oxycodone/APAP Pill/Patch Count:  18 of 60 pills remain Pill/Patch Appearance: Markings consistent with prescribed medication Bottle Appearance: Standard pharmacy container. Clearly labeled. Filled Date: 7 / 75 / 2023 Last Medication intake:  TodaySafety precautions to be maintained throughout the outpatient stay will include: orient to surroundings, keep bed in low position, maintain call bell within reach at all times, provide assistance with transfer out of bed and ambulation.

## 2022-05-06 ENCOUNTER — Ambulatory Visit
Payer: BC Managed Care – PPO | Attending: Student in an Organized Health Care Education/Training Program | Admitting: Student in an Organized Health Care Education/Training Program

## 2022-05-06 ENCOUNTER — Encounter: Payer: Self-pay | Admitting: Student in an Organized Health Care Education/Training Program

## 2022-05-06 ENCOUNTER — Other Ambulatory Visit: Payer: Self-pay | Admitting: *Deleted

## 2022-05-06 VITALS — BP 123/91 | HR 102 | Temp 97.7°F | Resp 16 | Ht 62.5 in | Wt 189.0 lb

## 2022-05-06 DIAGNOSIS — M542 Cervicalgia: Secondary | ICD-10-CM | POA: Diagnosis present

## 2022-05-06 DIAGNOSIS — Z9889 Other specified postprocedural states: Secondary | ICD-10-CM | POA: Diagnosis present

## 2022-05-06 DIAGNOSIS — G894 Chronic pain syndrome: Secondary | ICD-10-CM | POA: Insufficient documentation

## 2022-05-06 DIAGNOSIS — Z981 Arthrodesis status: Secondary | ICD-10-CM | POA: Diagnosis present

## 2022-05-06 DIAGNOSIS — M7918 Myalgia, other site: Secondary | ICD-10-CM | POA: Diagnosis present

## 2022-05-06 MED ORDER — METHOCARBAMOL 500 MG PO TABS: 500.0000 mg | ORAL_TABLET | Freq: Three times a day (TID) | ORAL | 5 refills | Status: DC | PRN

## 2022-05-06 MED ORDER — OXYCODONE-ACETAMINOPHEN 5-325 MG PO TABS: 1.0000 | ORAL_TABLET | Freq: Two times a day (BID) | ORAL | 0 refills | Status: AC | PRN

## 2022-05-06 NOTE — Progress Notes (Signed)
Nursing Pain Medication Assessment:  Safety precautions to be maintained throughout the outpatient stay will include: orient to surroundings, keep bed in low position, maintain call bell within reach at all times, provide assistance with transfer out of bed and ambulation.  Medication Inspection Compliance: Pill count conducted under aseptic conditions, in front of the patient. Neither the pills nor the bottle was removed from the patient's sight at any time. Once count was completed pills were immediately returned to the patient in their original bottle.  Medication: Oxycodone/APAP Pill/Patch Count:  13.5 of 60 pills remain Pill/Patch Appearance: Markings consistent with prescribed medication Bottle Appearance: Standard pharmacy container. Clearly labeled. Filled Date: 10 / 03 / 2023 Last Medication intake:  Yesterday

## 2022-05-06 NOTE — Progress Notes (Signed)
PROVIDER NOTE: Information contained herein reflects review and annotations entered in association with encounter. Interpretation of such information and data should be left to medically-trained personnel. Information provided to patient can be located elsewhere in the medical record under "Patient Instructions". Document created using STT-dictation technology, any transcriptional errors that may result from process are unintentional.    Patient: Shannon Cortez  Service Category: E/M  Provider: Gillis Santa, MD  DOB: 1966/02/14  DOS: 05/06/2022  Specialty: Interventional Pain Management  MRN: [id number]  Setting: Ambulatory outpatient  PCP: Pcp, No  Type: Established Patient    Referring Provider: No ref. provider found  Location: Office  Delivery: Face-to-face     HPI  [name], a 57 y.o. year old female, is here today because of her Chronic pain syndrome [G89.4]. [name] primary complain today is Neck Pain (Left s/p surgery ) and Back Pain (Lumbar midline )  Last encounter: My last encounter with her was on 02/04/22  Pertinent problems: [name] has Cellulitis; Chronic pain syndrome; Hx of fusion of cervical spine; Neck pain; and Myofascial pain syndrome on their pertinent problem list. Pain Assessment: Severity of Chronic pain is reported as a 4 /10. Location: Neck (back, lumbar midline) Left/neck into left shoulder.. Onset: More than a month ago. Quality: Discomfort, Constant, Sharp, Stabbing, Burning, Aching. Timing: Constant. Modifying factor(s): heating pad, ibuprofen, muscle relaxers and pain medications at night. Vitals:  height is 5' 2.5" (1.588 m) and weight is 189 lb (85.7 kg). Her temporal temperature is 97.7 F (36.5 C). Her blood pressure is 123/91 (abnormal) and her pulse is 102 (abnormal). Her respiration is 16 and oxygen saturation is 95%.   Reason for encounter: medication management.   No change in medical history since last visit.  Patient's pain is at  baseline.  Patient continues multimodal pain regimen as prescribed.  States that it provides pain relief and improvement in functional status. Renew annual urine toxicology screen today.   Pharmacotherapy Assessment  Analgesic: Percocet 5 mg daily as needed, quantity 60 last 3 months    Monitoring: Shannon Cortez PMP: PDMP reviewed during this encounter.       Pharmacotherapy: No side-effects or adverse reactions reported. Compliance: No problems identified. Effectiveness: Clinically acceptable.  Janett Billow, RN  05/06/2022  8:48 AM  Sign when Signing Visit Nursing Pain Medication Assessment:  Safety precautions to be maintained throughout the outpatient stay will include: orient to surroundings, keep bed in low position, maintain call bell within reach at all times, provide assistance with transfer out of bed and ambulation.  Medication Inspection Compliance: Pill count conducted under aseptic conditions, in front of the patient. Neither the pills nor the bottle was removed from the patient's sight at any time. Once count was completed pills were immediately returned to the patient in their original bottle.  Medication: Oxycodone/APAP Pill/Patch Count:  13.5 of 60 pills remain Pill/Patch Appearance: Markings consistent with prescribed medication Bottle Appearance: Standard pharmacy container. Clearly labeled. Filled Date: 10 / 03 / 2023 Last Medication intake:  Yesterday     UDS:  Summary  Date Value Ref Range Status  05/23/2021 Note  Final    Comment:    ==================================================================== [employer]ox[employer] Select 13 (MW) ==================================================================== Test                             Result       Flag       Units  Drug Present and  Declared for Prescription Verification   Oxycodone                      99           EXPECTED   ng/mg creat   Noroxycodone                   292          EXPECTED   ng/mg creat    Sources of  oxycodone include scheduled prescription medications.    Noroxycodone is an expected metabolite of oxycodone.  ==================================================================== Test                      Result    Flag   Units      Ref Range   Creatinine              115              mg/dL      >=20 ==================================================================== Declared Medications:  The flagging and interpretation on this report are based on the  following declared medications.  Unexpected results may arise from  inaccuracies in the declared medications.   **Note: The testing scope of this panel includes these medications:   Oxycodone (Percocet)   **Note: The testing scope of this panel does not include the  following reported medications:   Acetaminophen  Acetaminophen (Percocet)  Aspirin  Atorvastatin  Cannabidiol  Dextromethorphan  Diphenhydramine (Benadryl)  Doxycycline  Escitalopram (Lexapro)  Ibuprofen (Advil)  Losartan (Cozaar)  Methocarbamol  Phenylephrine  Topical ==================================================================== For clinical consultation, please call [phone]. ====================================================================      ROS  Constitutional: Denies any fever or chills Gastrointestinal: No reported hemesis, hematochezia, vomiting, or acute GI distress Musculoskeletal:  cervicalgia Neurological: No reported episodes of acute onset apraxia, aphasia, dysarthria, agnosia, amnesia, paralysis, loss of coordination, or loss of consciousness  Medication Review  NON FORMULARY, albuterol, amLODipine, aspirin EC, atorvastatin, diphenhydrAMINE, escitalopram, ibuprofen, losartan, methocarbamol, oxyCODONE-acetaminophen, and traZODone  History Review  Allergy: [name] is allergic to hyronan [sodium hyaluronate & lidocaine], toradol [ketorolac tromethamine], and sulfa antibiotics. Drug: [name]  reports no history of  drug use. Alcohol:  reports no history of alcohol use. Tobacco:  reports that she quit smoking about 8 years ago. Her smoking use included cigarettes. She has never used smokeless tobacco. Social: [name]  reports that she quit smoking about 8 years ago. Her smoking use included cigarettes. She has never used smokeless tobacco. She reports that she does not drink alcohol and does not use drugs. Medical:  has a past medical history of DDD (degenerative disc disease), lumbosacral, Hyperlipidemia, and Hypertension. Surgical: Ms. Heimann  has a past surgical history that includes Neck surgery; Abdominal hysterectomy; and Nasal reconstruction with septal repair. Family: family history includes Hepatitis in her mother.  Laboratory Chemistry Profile   Renal Lab Results  Component Value Date   BUN 13 04/18/2018   CREATININE 0.88 04/18/2018   GFRAA >60 04/18/2018   GFRNONAA >60 04/18/2018    Hepatic Lab Results  Component Value Date   AST 22 04/18/2018   ALT 21 04/18/2018   ALBUMIN 3.6 04/18/2018   ALKPHOS 94 04/18/2018   LIPASE 33 04/18/2018    Electrolytes Lab Results  Component Value Date   NA 139 04/18/2018   K 3.6 04/18/2018   CL 106 04/18/2018   CALCIUM 8.7 (L) 04/18/2018    Bone No results found for: "  VD25OH", "OV785YI5OYD", "XA1287OM7", "EH2094BS9", "25OHVITD1", "25OHVITD2", "62EZMOQH4", "TESTOFREE", "TESTOSTERONE"  Inflammation (CRP: Acute Phase) (ESR: Chronic Phase) Lab Results  Component Value Date   LATICACIDVEN 1.13 04/16/2018         Note: Above Lab results reviewed.  Recent Imaging Review  DG Wrist Complete Left CLINICAL DATA:  Left posterior wrist pain laceration following a fall today.  EXAM: LEFT WRIST - COMPLETE 3+ VIEW  COMPARISON:  None.  FINDINGS: Shallow linear soft tissue defect in the dorsal aspect of the wrist. Two small infused ossicles distal to the ulnar styloid. No acute fracture or dislocation.  IMPRESSION: No acute fracture or  dislocation.  Electronically Signed   By: Claudie Revering M.D.   On: 04/12/2018 21:36  Note: Reviewed        Physical Exam  General appearance: Well nourished, well developed, and well hydrated. In no apparent acute distress Mental status: Alert, oriented x 3 (person, place, & time)       Respiratory: No evidence of acute respiratory distress Eyes: PERLA Vitals: BP (!) 123/91 (BP Location: Right Arm, Patient Position: Sitting, Cuff Size: Normal)   Pulse (!) 102   Temp 97.7 F (36.5 C) (Temporal)   Resp 16   Ht 5' 2.5" (1.588 m)   Wt 189 lb (85.7 kg)   SpO2 95%   BMI 34.02 kg/m  BMI: Estimated body mass index is 34.02 kg/m as calculated from the following:   Height as of this encounter: 5' 2.5" (1.588 m).   Weight as of this encounter: 189 lb (85.7 kg). Ideal: Ideal body weight: 51.3 kg (112 lb 15.8 oz) Adjusted ideal body weight: 65 kg (143 lb 6.3 oz)  Cervical spine pain Limited cervical extension Surgical scar present 5 out of 5 strength bilateral upper extremity: Shoulder abduction, elbow flexion, elbow extension, thumb extension.   Assessment   Status Diagnosis  Controlled Controlled Controlled 1. Chronic pain syndrome   2. Hx of fusion of cervical spine   3. Neck pain   4. Myofascial pain syndrome   5. H/O cervical spine surgery ([facility] C5-C7 in [place])       Plan of Care    Ms. Margretta Zamorano has a current medication list which includes the following long-term medication(s): albuterol, amlodipine, atorvastatin, diphenhydramine, escitalopram, losartan, and trazodone.  Pharmacotherapy (Medications Ordered): Meds ordered this encounter  Medications   oxyCODONE-acetaminophen (PERCOCET) 5-325 MG tablet    Sig: Take 1 tablet by mouth every 12 (twelve) hours as needed for severe pain. Must last 30 days.    Dispense:  60 tablet    Refill:  0    Chronic Pain: STOP Act (Not applicable) Fill 1 day early if closed on refill date. Avoid benzodiazepines within  8 hours of opioids  Continue with Robaxin prn and psych management   Orders:  Orders Placed This Encounter  Procedures   ToxASSURE Select 13 (MW), Urine    Volume: 30 ml(s). Minimum 3 ml of urine is needed. Document temperature of fresh sample. Indications: Long term (current) use of opiate analgesic [phone])    Order Specific Question:   Release to patient    Answer:   Immediate    Follow-up plan:   Return in about 3 months (around 08/05/2022) for Medication Management, in person.    Recent Visits No visits were found meeting these conditions. Showing recent visits within past 90 days and meeting all other requirements Today's Visits Date Type Provider Dept  05/06/22 Office Visit Shannon Santa, MD [facility]  Showing today's visits and meeting all other requirements Future Appointments No visits were found meeting these conditions. Showing future appointments within next 90 days and meeting all other requirements  I discussed the assessment and treatment plan with the patient. The patient was provided an opportunity to ask questions and all were answered. The patient agreed with the plan and demonstrated an understanding of the instructions.  Patient advised to call back or seek an in-person evaluation if the symptoms or condition worsens.  Duration of encounter: 9mnutes.  Note by: BGillis Santa MD Date: 05/06/2022; Time: 9:13 AM

## 2022-05-08 LAB — TOXASSURE SELECT 13 (MW), URINE

## 2022-07-29 ENCOUNTER — Ambulatory Visit
Payer: BC Managed Care – PPO | Attending: Student in an Organized Health Care Education/Training Program | Admitting: Student in an Organized Health Care Education/Training Program

## 2022-07-29 ENCOUNTER — Encounter: Payer: Self-pay | Admitting: Student in an Organized Health Care Education/Training Program

## 2022-07-29 VITALS — BP 124/62 | HR 94 | Temp 98.1°F | Resp 16 | Ht 62.5 in | Wt 198.0 lb

## 2022-07-29 DIAGNOSIS — G894 Chronic pain syndrome: Secondary | ICD-10-CM | POA: Diagnosis not present

## 2022-07-29 DIAGNOSIS — M7918 Myalgia, other site: Secondary | ICD-10-CM | POA: Diagnosis not present

## 2022-07-29 DIAGNOSIS — Z981 Arthrodesis status: Secondary | ICD-10-CM

## 2022-07-29 DIAGNOSIS — Z9889 Other specified postprocedural states: Secondary | ICD-10-CM

## 2022-07-29 DIAGNOSIS — M542 Cervicalgia: Secondary | ICD-10-CM

## 2022-07-29 MED ORDER — OXYCODONE-ACETAMINOPHEN 5-325 MG PO TABS: 1.0000 | ORAL_TABLET | Freq: Two times a day (BID) | ORAL | 0 refills | Status: AC | PRN

## 2022-07-29 NOTE — Progress Notes (Signed)
Nursing Pain Medication Assessment:  Safety precautions to be maintained throughout the outpatient stay will include: orient to surroundings, keep bed in low position, maintain call bell within reach at all times, provide assistance with transfer out of bed and ambulation.  Medication Inspection Compliance: Pill count conducted under aseptic conditions, in front of the patient. Neither the pills nor the bottle was removed from the patient's sight at any time. Once count was completed pills were immediately returned to the patient in their original bottle.  Medication: Oxycodone/APAP Pill/Patch Count:  13.75 of 60 pills remain Pill/Patch Appearance: Markings consistent with prescribed medication Bottle Appearance: Standard pharmacy container. Clearly labeled. Filled Date: 01 / 02 / 2024 Last Medication intake:  Yesterday

## 2022-07-29 NOTE — Progress Notes (Signed)
PROVIDER NOTE: Information contained herein reflects review and annotations entered in association with encounter. Interpretation of such information and data should be left to medically-trained personnel. Information provided to patient can be located elsewhere in the medical record under "Patient Instructions". Document created using STT-dictation technology, any transcriptional errors that may result from process are unintentional.    Patient: Shannon Cortez  Service Category: E/M  Provider: Gillis Santa, MD  DOB: [birth year]  DOS: 07/29/2022  Specialty: Interventional Pain Management  MRN: [id number]  Setting: Ambulatory outpatient  PCP: Pcp, No  Type: Established Patient    Referring Provider: No ref. provider found  Location: Office  Delivery: Face-to-face     HPI  [name], a 57 y.o. year old female, is here today because of her Chronic pain syndrome [G89.4]. [name] primary complain today is Neck Pain (Left is worse), Shoulder Pain (Left ), and Back Pain (Lumbar midline )  Last encounter: My last encounter with her was on 05/06/22  Pertinent problems: [name] has Cellulitis; Chronic pain syndrome; Hx of fusion of cervical spine; Neck pain; and Myofascial pain syndrome on their pertinent problem list. Pain Assessment: Severity of Chronic pain is reported as a 5 /10. Location: Neck (left shoulder and lumbar midline) Left/neck pain into left shoulder and as the day goes on back pain goes into hips.. Onset: More than a month ago. Quality: Discomfort, Constant, Sharp, Stabbing, Burning, Aching. Timing: Constant. Modifying factor(s): heat, medications. Vitals:  height is 5' 2.5" (1.588 m) and weight is 198 lb (89.8 kg). Her temporal temperature is 98.1 F (36.7 C). Her blood pressure is 124/62 and her pulse is 94. Her respiration is 16 and oxygen saturation is 94%.   Reason for encounter: medication management.   No change in medical history since last visit.  Patient's  pain is at baseline.  Patient continues multimodal pain regimen as prescribed.  States that it provides pain relief and improvement in functional status. Patient has stopped ASA 81 mg per recommendation from PCP   Pharmacotherapy Assessment  Analgesic: Percocet 5 mg daily as needed, quantity 60 last 3 months    Monitoring: [place] PMP: PDMP reviewed during this encounter.       Pharmacotherapy: No side-effects or adverse reactions reported. Compliance: No problems identified. Effectiveness: Clinically acceptable.  Janett Billow, RN  07/29/2022  8:17 AM  Sign when Signing Visit Nursing Pain Medication Assessment:  Safety precautions to be maintained throughout the outpatient stay will include: orient to surroundings, keep bed in low position, maintain call bell within reach at all times, provide assistance with transfer out of bed and ambulation.  Medication Inspection Compliance: Pill count conducted under aseptic conditions, in front of the patient. Neither the pills nor the bottle was removed from the patient's sight at any time. Once count was completed pills were immediately returned to the patient in their original bottle.  Medication: Oxycodone/APAP Pill/Patch Count:  13.75 of 60 pills remain Pill/Patch Appearance: Markings consistent with prescribed medication Bottle Appearance: Standard pharmacy container. Clearly labeled. Filled Date: 01 / 02 / 2024 Last Medication intake:  Yesterday     UDS:  Summary  Date Value Ref Range Status  05/06/2022 Note  Final    Comment:    ==================================================================== ToxASSURE Select 13 (MW) ==================================================================== Test                             Result       Flag  Units  Drug Present and Declared for Prescription Verification   Oxycodone                      179          EXPECTED   ng/mg creat   Noroxycodone                   192          EXPECTED    ng/mg creat    Sources of oxycodone include scheduled prescription medications.    Noroxycodone is an expected metabolite of oxycodone.  ==================================================================== Test                      Result    Flag   Units      Ref Range   Creatinine              123              mg/dL      >=20 ==================================================================== Declared Medications:  The flagging and interpretation on this report are based on the  following declared medications.  Unexpected results may arise from  inaccuracies in the declared medications.   **Note: The testing scope of this panel includes these medications:   Oxycodone (Percocet)   **Note: The testing scope of this panel does not include the  following reported medications:   Acetaminophen (Percocet)  Albuterol (Ventolin HFA)  Amlodipine (Norvasc)  Aspirin  Atorvastatin (Lipitor)  Diphenhydramine (Benadryl)  Escitalopram (Lexapro)  Ibuprofen (Advil)  Losartan (Cozaar)  Methocarbamol (Robaxin)  Topical  Trazodone (Desyrel) ==================================================================== For clinical consultation, please call [phone]. ====================================================================      ROS  Constitutional: Denies any fever or chills Gastrointestinal: No reported hemesis, hematochezia, vomiting, or acute GI distress Musculoskeletal:  cervicalgia Neurological: No reported episodes of acute onset apraxia, aphasia, dysarthria, agnosia, amnesia, paralysis, loss of coordination, or loss of consciousness  Medication Review  NON FORMULARY, albuterol, amLODipine, atorvastatin, diphenhydrAMINE, escitalopram, ibuprofen, losartan, methocarbamol, oxyCODONE-acetaminophen, and traZODone  History Review  Allergy: [name] is allergic to hyronan [sodium hyaluronate & lidocaine], toradol [ketorolac tromethamine], and sulfa antibiotics. Drug: Ms.  Shannon Cortez  reports no history of drug use. Alcohol:  reports no history of alcohol use. Tobacco:  reports that she quit smoking about 8 years ago. Her smoking use included cigarettes. She has never used smokeless tobacco. Social: [name]  reports that she quit smoking about 8 years ago. Her smoking use included cigarettes. She has never used smokeless tobacco. She reports that she does not drink alcohol and does not use drugs. Medical:  has a past medical history of DDD (degenerative disc disease), lumbosacral, Hyperlipidemia, and Hypertension. Surgical: [name]  has a past surgical history that includes Neck surgery; Abdominal hysterectomy; and Nasal reconstruction with septal repair. Family: family history includes Hepatitis in her mother.  Laboratory Chemistry Profile   Renal Lab Results  Component Value Date   BUN 13 04/18/2018   CREATININE 0.88 04/18/2018   GFRAA >60 04/18/2018   GFRNONAA >60 04/18/2018    Hepatic Lab Results  Component Value Date   AST 22 04/18/2018   ALT 21 04/18/2018   ALBUMIN 3.6 04/18/2018   ALKPHOS 94 04/18/2018   LIPASE 33 04/18/2018    Electrolytes Lab Results  Component Value Date   NA 139 04/18/2018   K 3.6 04/18/2018   CL 106 04/18/2018   CALCIUM 8.7 (L) 04/18/2018    Bone  No results found for: "VD25OH", "VD125OH2TOT", "[id number]", "[id number]", "25OHVITD1", "25OHVITD2", "[id number]", "TESTOFREE", "TESTOSTERONE"  Inflammation (CRP: Acute Phase) (ESR: Chronic Phase) Lab Results  Component Value Date   LATICACIDVEN 1.13 04/16/2018         Note: Above Lab results reviewed.  Recent Imaging Review  DG Wrist Complete Left CLINICAL DATA:  Left posterior wrist pain laceration following a fall today.  EXAM: LEFT WRIST - COMPLETE 3+ VIEW  COMPARISON:  None.  FINDINGS: Shallow linear soft tissue defect in the dorsal aspect of the wrist. Two small infused ossicles distal to the ulnar styloid. No acute fracture or  dislocation.  IMPRESSION: No acute fracture or dislocation.  Electronically Signed   By: Claudie Revering M.D.   On: 04/12/2018 21:36  Note: Reviewed        Physical Exam  General appearance: Well nourished, well developed, and well hydrated. In no apparent acute distress Mental status: Alert, oriented x 3 (person, place, & time)       Respiratory: No evidence of acute respiratory distress Eyes: PERLA Vitals: BP 124/62 (BP Location: Right Arm, Patient Position: Sitting, Cuff Size: Large)   Pulse 94   Temp 98.1 F (36.7 C) (Temporal)   Resp 16   Ht 5' 2.5" (1.588 m)   Wt 198 lb (89.8 kg)   SpO2 94%   BMI 35.64 kg/m  BMI: Estimated body mass index is 35.64 kg/m as calculated from the following:   Height as of this encounter: 5' 2.5" (1.588 m).   Weight as of this encounter: 198 lb (89.8 kg). Ideal: Ideal body weight: 51.3 kg (112 lb 15.8 oz) Adjusted ideal body weight: 66.7 kg (146 lb 15.9 oz)  Cervical spine pain Limited cervical extension Surgical scar present 5 out of 5 strength bilateral upper extremity: Shoulder abduction, elbow flexion, elbow extension, thumb extension.   Assessment   Status Diagnosis  Controlled Controlled Controlled 1. Chronic pain syndrome   2. Hx of fusion of cervical spine   3. Neck pain   4. Myofascial pain syndrome   5. H/O cervical spine surgery ([facility] C5-C7 in [place])        Plan of Care    Ms. Oniyah Hertzberg has a current medication list which includes the following long-term medication(s): albuterol, amlodipine, atorvastatin, diphenhydramine, escitalopram, losartan, and trazodone.  Pharmacotherapy (Medications Ordered): Meds ordered this encounter  Medications   oxyCODONE-acetaminophen (PERCOCET) 5-325 MG tablet    Sig: Take 1 tablet by mouth every 12 (twelve) hours as needed for severe pain. Must last 30 days.    Dispense:  60 tablet    Refill:  0    Chronic Pain: STOP Act (Not applicable) Fill 1 day early if closed  on refill date. Avoid benzodiazepines within 8 hours of opioids  Continue with Robaxin prn and psych management   Orders:  No orders of the defined types were placed in this encounter.   Follow-up plan:   Return in about 3 months (around 10/30/2022) for Medication Management, in person.    Recent Visits Date Type Provider Dept  05/06/22 Office Visit Gillis Santa, MD [facility]  Showing recent visits within past 90 days and meeting all other requirements Today's Visits Date Type Provider Dept  07/29/22 Office Visit Gillis Santa, MD [facility]  Showing today's visits and meeting all other requirements Future Appointments No visits were found meeting these conditions. Showing future appointments within next 90 days and meeting all other requirements  I discussed the assessment and  treatment plan with the patient. The patient was provided an opportunity to ask questions and all were answered. The patient agreed with the plan and demonstrated an understanding of the instructions.  Patient advised to call back or seek an in-person evaluation if the symptoms or condition worsens.  Duration of encounter: 35minutes.  Note by: Gillis Santa, MD Date: 07/29/2022; Time: 8:34 AM

## 2022-08-11 ENCOUNTER — Other Ambulatory Visit: Payer: Self-pay | Admitting: Family Medicine

## 2022-08-11 DIAGNOSIS — Z1231 Encounter for screening mammogram for malignant neoplasm of breast: Secondary | ICD-10-CM

## 2022-09-26 ENCOUNTER — Ambulatory Visit
Admission: RE | Admit: 2022-09-26 | Discharge: 2022-09-26 | Disposition: A | Discharge status: Home / Self Care | Payer: BC Managed Care – PPO | Source: Ambulatory Visit | Attending: Family Medicine | Admitting: Family Medicine

## 2022-09-26 DIAGNOSIS — Z1231 Encounter for screening mammogram for malignant neoplasm of breast: Secondary | ICD-10-CM | POA: Diagnosis present

## 2022-10-28 ENCOUNTER — Encounter: Payer: Self-pay | Admitting: Student in an Organized Health Care Education/Training Program

## 2022-10-28 ENCOUNTER — Ambulatory Visit
Payer: BC Managed Care – PPO | Attending: Student in an Organized Health Care Education/Training Program | Admitting: Student in an Organized Health Care Education/Training Program

## 2022-10-28 VITALS — BP 118/65 | HR 85 | Temp 97.3°F | Ht 62.0 in | Wt 197.0 lb

## 2022-10-28 DIAGNOSIS — G894 Chronic pain syndrome: Secondary | ICD-10-CM | POA: Diagnosis present

## 2022-10-28 DIAGNOSIS — Z981 Arthrodesis status: Secondary | ICD-10-CM | POA: Diagnosis present

## 2022-10-28 DIAGNOSIS — M542 Cervicalgia: Secondary | ICD-10-CM | POA: Diagnosis present

## 2022-10-28 MED ORDER — OXYCODONE-ACETAMINOPHEN 5-325 MG PO TABS: 1.0000 | ORAL_TABLET | Freq: Two times a day (BID) | ORAL | 0 refills | Status: AC | PRN

## 2022-10-28 MED ORDER — METHOCARBAMOL 500 MG PO TABS: 500.0000 mg | ORAL_TABLET | Freq: Three times a day (TID) | ORAL | 5 refills | Status: DC | PRN

## 2022-10-28 NOTE — Progress Notes (Signed)
Nursing Pain Medication Assessment:  Safety precautions to be maintained throughout the outpatient stay will include: orient to surroundings, keep bed in low position, maintain call bell within reach at all times, provide assistance with transfer out of bed and ambulation.  Medication Inspection Compliance: Pill count conducted under aseptic conditions, in front of the patient. Neither the pills nor the bottle was removed from the patient's sight at any time. Once count was completed pills were immediately returned to the patient in their original bottle.  Medication: Oxycodone/APAP Pill/Patch Count:  11.5 of 60 pills remain Pill/Patch Appearance: Markings consistent with prescribed medication Bottle Appearance: Standard pharmacy container. Clearly labeled. Filled Date: 04 / 3 / 2024 Last Medication intake:  Yesterday

## 2022-10-28 NOTE — Progress Notes (Signed)
PROVIDER NOTE: Information contained herein reflects review and annotations entered in association with encounter. Interpretation of such information and data should be left to medically-trained personnel. Information provided to patient can be located elsewhere in the medical record under "Patient Instructions". Document created using STT-dictation technology, any transcriptional errors that may result from process are unintentional.    Patient: Shannon Cortez  Service Category: E/M  Provider: Edward Jolly, MD  DOB: May 17, 1965  DOS: 10/28/2022  Specialty: Interventional Pain Management  MRN: [id number]  Setting: Ambulatory outpatient  PCP: Marisue Ivan, MD  Type: Established Patient    Referring Provider: No ref. provider found  Location: Office  Delivery: Face-to-face     HPI  [name], a 57 y.o. year old female, is here today because of her Chronic pain syndrome [G89.4]. [name] primary complain today is Neck Pain  Last encounter: My last encounter with her was on 07/29/22  Pertinent problems: [name] has Cellulitis; Chronic pain syndrome; Hx of fusion of cervical spine; Neck pain; and Myofascial pain syndrome on their pertinent problem list. Pain Assessment: Severity of Chronic pain is reported as a 5 /10. Location: Neck  /radiates to left shoulder. Onset: More than a month ago. Quality: Aching, Constant. Timing: Constant. Modifying factor(s): heat, meds. Vitals:  height is 5\' 2"  (1.575 m) and weight is 197 lb (89.4 kg). Her temporal temperature is 97.3 F (36.3 C) (abnormal). Her blood pressure is 118/65 and her pulse is 85. Her oxygen saturation is 100%.   Reason for encounter: medication management.   No change in medical history since last visit.  Patient's pain is at baseline.  Patient continues multimodal pain regimen as prescribed.  States that it provides pain relief and improvement in functional status. States that she has gained approx 20lbs, no change  in diet or exercise, has been on Effexor the last year and thinks that could be contributing.  Pharmacotherapy Assessment  Analgesic: Percocet 5 mg daily as needed, quantity 60 last 3 months    Monitoring: [place] PMP: PDMP reviewed during this encounter.       Pharmacotherapy: No side-effects or adverse reactions reported. Compliance: No problems identified. Effectiveness: Clinically acceptable.  Florina Ou, RN  10/28/2022  8:30 AM  Sign when Signing Visit Nursing Pain Medication Assessment:  Safety precautions to be maintained throughout the outpatient stay will include: orient to surroundings, keep bed in low position, maintain call bell within reach at all times, provide assistance with transfer out of bed and ambulation.  Medication Inspection Compliance: Pill count conducted under aseptic conditions, in front of the patient. Neither the pills nor the bottle was removed from the patient's sight at any time. Once count was completed pills were immediately returned to the patient in their original bottle.  Medication: Oxycodone/APAP Pill/Patch Count:  11.5 of 60 pills remain Pill/Patch Appearance: Markings consistent with prescribed medication Bottle Appearance: Standard pharmacy container. Clearly labeled. Filled Date: 04 / 3 / 2024 Last Medication intake:  Yesterday     UDS:  Summary  Date Value Ref Range Status  05/06/2022 Note  Final    Comment:    ==================================================================== [employer] Select 13 (MW) ==================================================================== Test                             Result       Flag       Units  Drug Present and Declared for Prescription Verification   Oxycodone  179          EXPECTED   ng/mg creat   Noroxycodone                   192          EXPECTED   ng/mg creat    Sources of oxycodone include scheduled prescription medications.    Noroxycodone is an expected metabolite of  oxycodone.  ==================================================================== Test                      Result    Flag   Units      Ref Range   Creatinine              123              mg/dL      >=42 ==================================================================== Declared Medications:  The flagging and interpretation on this report are based on the  following declared medications.  Unexpected results may arise from  inaccuracies in the declared medications.   **Note: The testing scope of this panel includes these medications:   Oxycodone (Percocet)   **Note: The testing scope of this panel does not include the  following reported medications:   Acetaminophen (Percocet)  Albuterol (Ventolin HFA)  Amlodipine (Norvasc)  Aspirin  Atorvastatin (Lipitor)  Diphenhydramine (Benadryl)  Escitalopram (Lexapro)  Ibuprofen (Advil)  Losartan (Cozaar)  Methocarbamol (Robaxin)  Topical  Trazodone (Desyrel) ==================================================================== For clinical consultation, please call [phone]. ====================================================================      ROS  Constitutional: Denies any fever or chills Gastrointestinal: No reported hemesis, hematochezia, vomiting, or acute GI distress Musculoskeletal:  cervicalgia Neurological: No reported episodes of acute onset apraxia, aphasia, dysarthria, agnosia, amnesia, paralysis, loss of coordination, or loss of consciousness  Medication Review  NON FORMULARY, albuterol, amLODipine, atorvastatin, diphenhydrAMINE, escitalopram, ibuprofen, losartan, methocarbamol, oxyCODONE-acetaminophen, and traZODone  History Review  Allergy: [name] is allergic to hyronan [sodium hyaluronate & lidocaine], toradol [ketorolac tromethamine], and sulfa antibiotics. Drug: [name]  reports no history of drug use. Alcohol:  reports no history of alcohol use. Tobacco:  reports that she quit smoking  about 8 years ago. Her smoking use included cigarettes. She has never used smokeless tobacco. Social: [name]  reports that she quit smoking about 8 years ago. Her smoking use included cigarettes. She has never used smokeless tobacco. She reports that she does not drink alcohol and does not use drugs. Medical:  has a past medical history of DDD (degenerative disc disease), lumbosacral, Hyperlipidemia, and Hypertension. Surgical: [name]  has a past surgical history that includes Neck surgery; Abdominal hysterectomy; and Nasal reconstruction with septal repair. Family: family history includes Hepatitis in her mother.  Laboratory Chemistry Profile   Renal Lab Results  Component Value Date   BUN 13 04/18/2018   CREATININE 0.88 04/18/2018   GFRAA >60 04/18/2018   GFRNONAA >60 04/18/2018    Hepatic Lab Results  Component Value Date   AST 22 04/18/2018   ALT 21 04/18/2018   ALBUMIN 3.6 04/18/2018   ALKPHOS 94 04/18/2018   LIPASE 33 04/18/2018    Electrolytes Lab Results  Component Value Date   NA 139 04/18/2018   K 3.6 04/18/2018   CL 106 04/18/2018   CALCIUM 8.7 (L) 04/18/2018    Bone No results found for: "VD25OH", "VD125OH2TOT", "PP2951OA4", "ZY6063KZ6", "25OHVITD1", "25OHVITD2", "25OHVITD3", "TESTOFREE", "TESTOSTERONE"  Inflammation (CRP: Acute Phase) (ESR: Chronic Phase) Lab Results  Component Value Date   LATICACIDVEN 1.13 04/16/2018  Note: Above Lab results reviewed.  Recent Imaging Review  MM 3D SCREENING MAMMOGRAM BILATERAL BREAST CLINICAL DATA:  Screening.  EXAM: DIGITAL SCREENING BILATERAL MAMMOGRAM WITH TOMOSYNTHESIS AND CAD  TECHNIQUE: Bilateral screening digital craniocaudal and mediolateral oblique mammograms were obtained. Bilateral screening digital breast tomosynthesis was performed. The images were evaluated with computer-aided detection.  COMPARISON:  None available.  ACR Breast Density Category a: The breasts are almost  entirely fatty.  FINDINGS: There are no findings suspicious for malignancy.  IMPRESSION: No mammographic evidence of malignancy. A result letter of this screening mammogram will be mailed directly to the patient.  RECOMMENDATION: Screening mammogram in one year. (Code:SM-B-01Y)  BI-RADS CATEGORY  1: Negative.  Electronically Signed   By: Frederico Hamman M.D.   On: 09/30/2022 12:18  Note: Reviewed        Physical Exam  General appearance: Well nourished, well developed, and well hydrated. In no apparent acute distress Mental status: Alert, oriented x 3 (person, place, & time)       Respiratory: No evidence of acute respiratory distress Eyes: PERLA Vitals: BP 118/65 (BP Location: Right Arm, Patient Position: Sitting, Cuff Size: Large)   Pulse 85   Temp (!) 97.3 F (36.3 C) (Temporal)   Ht 5\' 2"  (1.575 m)   Wt 197 lb (89.4 kg)   SpO2 100%   BMI 36.03 kg/m  BMI: Estimated body mass index is 36.03 kg/m as calculated from the following:   Height as of this encounter: 5\' 2"  (1.575 m).   Weight as of this encounter: 197 lb (89.4 kg). Ideal: Ideal body weight: 50.1 kg (110 lb 7.2 oz) Adjusted ideal body weight: 65.8 kg (145 lb 1.1 oz)  Cervical spine pain Limited cervical extension Surgical scar present 5 out of 5 strength bilateral upper extremity: Shoulder abduction, elbow flexion, elbow extension, thumb extension.   Assessment   Status Diagnosis  Controlled Controlled Controlled 1. Chronic pain syndrome   2. Hx of fusion of cervical spine   3. Neck pain         Plan of Care    [name] has a current medication list which includes the following long-term medication(s): albuterol, amlodipine, atorvastatin, diphenhydramine, escitalopram, losartan, and trazodone.  Pharmacotherapy (Medications Ordered): Meds ordered this encounter  Medications   oxyCODONE-acetaminophen (PERCOCET) 5-325 MG tablet    Sig: Take 1 tablet by mouth every 12 (twelve)  hours as needed for severe pain. Must last 30 days.    Dispense:  60 tablet    Refill:  0    Chronic Pain: STOP Act (Not applicable) Fill 1 day early if closed on refill date. Avoid benzodiazepines within 8 hours of opioids   methocarbamol (ROBAXIN) 500 MG tablet    Sig: Take 1 tablet (500 mg total) by mouth every 8 (eight) hours as needed for muscle spasms.    Dispense:  90 tablet    Refill:  5    Do not place this medication, or any other prescription from our practice, on "Automatic Refill". Patient may have prescription filled one day early if pharmacy is closed on scheduled refill date.    Orders:  Orders Placed This Encounter  Procedures   [employer] Select 13 (MW), Urine    Volume: 30 ml(s). Minimum 3 ml of urine is needed. Document temperature of fresh sample. Indications: Long term (current) use of opiate analgesic (W29.562)    Order Specific Question:   Release to patient    Answer:   Immediate  Follow-up plan:   Return in about 3 months (around 01/28/2023) for Medication Management, in person.    Recent Visits No visits were found meeting these conditions. Showing recent visits within past 90 days and meeting all other requirements Today's Visits Date Type Provider Dept  10/28/22 Office Visit Edward Jolly, MD [facility]  Showing today's visits and meeting all other requirements Future Appointments No visits were found meeting these conditions. Showing future appointments within next 90 days and meeting all other requirements  I discussed the assessment and treatment plan with the patient. The patient was provided an opportunity to ask questions and all were answered. The patient agreed with the plan and demonstrated an understanding of the instructions.  Patient advised to call back or seek an in-person evaluation if the symptoms or condition worsens.  Duration of encounter: .  Note by: Edward Jolly, MD Date: 10/28/2022; Time: 8:34 AM

## 2022-10-31 LAB — TOXASSURE SELECT 13 (MW), URINE

## 2023-01-15 ENCOUNTER — Ambulatory Visit
Payer: BC Managed Care – PPO | Attending: Student in an Organized Health Care Education/Training Program | Admitting: Student in an Organized Health Care Education/Training Program

## 2023-01-15 ENCOUNTER — Encounter: Payer: Self-pay | Admitting: Student in an Organized Health Care Education/Training Program

## 2023-01-15 VITALS — BP 141/73 | HR 98 | Temp 97.3°F | Resp 16 | Ht 62.0 in | Wt 199.0 lb

## 2023-01-15 DIAGNOSIS — M542 Cervicalgia: Secondary | ICD-10-CM | POA: Insufficient documentation

## 2023-01-15 DIAGNOSIS — G894 Chronic pain syndrome: Secondary | ICD-10-CM | POA: Insufficient documentation

## 2023-01-15 DIAGNOSIS — Z981 Arthrodesis status: Secondary | ICD-10-CM | POA: Diagnosis present

## 2023-01-15 MED ORDER — OXYCODONE-ACETAMINOPHEN 5-325 MG PO TABS: 1.0000 | ORAL_TABLET | Freq: Two times a day (BID) | ORAL | 0 refills | Status: AC | PRN

## 2023-01-15 MED ORDER — METHOCARBAMOL 750 MG PO TABS: 750.0000 mg | ORAL_TABLET | Freq: Three times a day (TID) | ORAL | 2 refills | Status: DC | PRN

## 2023-01-15 NOTE — Progress Notes (Signed)
Nursing Pain Medication Assessment:  Safety precautions to be maintained throughout the outpatient stay will include: orient to surroundings, keep bed in low position, maintain call bell within reach at all times, provide assistance with transfer out of bed and ambulation.  Medication Inspection Compliance: Pill count conducted under aseptic conditions, in front of the patient. Neither the pills nor the bottle was removed from the patient's sight at any time. Once count was completed pills were immediately returned to the patient in their original bottle.  Medication: Oxycodone/APAP Pill/Patch Count:  8 of 60 pills remain Pill/Patch Appearance: Markings consistent with prescribed medication Bottle Appearance: Standard pharmacy container. Clearly labeled. Filled Date: 06 / 25 / 2024 Last Medication intake:  Yesterday

## 2023-01-15 NOTE — Patient Instructions (Signed)
Medication Rules  Applies to: All patients receiving prescriptions (written or electronic).  Pharmacy of record: Pharmacy where electronic prescriptions will be sent. If written prescriptions are taken to a different pharmacy, please inform the nursing staff. The pharmacy listed in the electronic medical record should be the one where you would like electronic prescriptions to be sent.  Prescription refills: Only during scheduled appointments. Applies to both, written and electronic prescriptions.  NOTE: The following applies primarily to controlled substances (Opioid* Pain Medications).   Patient's responsibilities: Pain Pills: Bring all pain pills to every appointment (except for procedure appointments). Pill Bottles: Bring pills in original pharmacy bottle. Always bring newest bottle. Bring bottle, even if empty. Medication refills: You are responsible for knowing and keeping track of what medications you need refilled. The day before your appointment, write a list of all prescriptions that need to be refilled. Bring that list to your appointment and give it to the admitting nurse. Prescriptions will be written only during appointments. If you forget a medication, it will not be "Called in", "Faxed", or "electronically sent". You will need to get another appointment to get these prescribed. Prescription Accuracy: You are responsible for carefully inspecting your prescriptions before leaving our office. Have the discharge nurse carefully go over each prescription with you, before taking them home. Make sure that your name is accurately spelled, that your address is correct. Check the name and dose of your medication to make sure it is accurate. Check the number of pills, and the written instructions to make sure they are clear and accurate. Make sure that you are given enough medication to last until your next medication refill appointment. Taking Medication: Take medication as prescribed. Never  take more pills than instructed. Never take medication more frequently than prescribed. Taking less pills or less frequently is permitted and encouraged, when it comes to controlled substances (written prescriptions).  Inform other Doctors: Always inform, all of your healthcare providers, of all the medications you take. Pain Medication from other Providers: You are not allowed to accept any additional pain medication from any other Doctor or Healthcare provider. There are two exceptions to this rule. (see below) In the event that you require additional pain medication, you are responsible for notifying us, as stated below. Medication Agreement: You are responsible for carefully reading and following our Medication Agreement. This must be signed before receiving any prescriptions from our practice. Safely store a copy of your signed Agreement. Violations to the Agreement will result in no further prescriptions. (Additional copies of our Medication Agreement are available upon request.) Laws, Rules, & Regulations: All patients are expected to follow all Federal and State Laws, Statutes, Rules, & Regulations. Ignorance of the Laws does not constitute a valid excuse. The use of any illegal substances is prohibited. Adopted CDC guidelines & recommendations: Target dosing levels will be at or below 60 MME/day. Use of benzodiazepines** is not recommended.  Exceptions: There are only two exceptions to the rule of not receiving pain medications from other Healthcare Providers. Exception #1 (Emergencies): In the event of an emergency (i.e.: accident requiring emergency care), you are allowed to receive additional pain medication. However, you are responsible for: As soon as you are able, call our office [phone], at any time of the day or night, and leave a message stating your name, the date and nature of the emergency, and the name and dose of the medication prescribed. In the event that your call is answered  by a member of   our staff, make sure to document and save the date, time, and the name of the person that took your information.  Exception #2 (Planned Surgery): In the event that you are scheduled by another doctor or dentist to have any type of surgery or procedure, you are allowed (for a period no longer than 30 days), to receive additional pain medication, for the acute post-op pain. However, in this case, you are responsible for picking up a copy of our "Post-op Pain Management for Surgeons" handout, and giving it to your surgeon or dentist. This document is available at our office, and does not require an appointment to obtain it. Simply go to our office during business hours (Monday-Thursday from 8:00 AM to 4:00 PM) (Friday 8:00 AM to 12:00 Noon) or if you have a scheduled appointment with us, prior to your surgery, and ask for it by name. In addition, you will need to provide us with your name, name of your surgeon, type of surgery, and date of procedure or surgery.  *Opioid medications include: morphine, codeine, oxycodone, oxymorphone, hydrocodone, hydromorphone, meperidine, tramadol, tapentadol, buprenorphine, fentanyl, methadone. **Benzodiazepine medications include: diazepam (Valium), alprazolam (Xanax), clonazepam (Klonopine), lorazepam (Ativan), clorazepate (Tranxene), chlordiazepoxide (Librium), estazolam (Prosom), oxazepam (Serax), temazepam (Restoril), triazolam (Halcion) (Last updated: 07/02/2017)  

## 2023-01-15 NOTE — Progress Notes (Signed)
PROVIDER NOTE: Information contained herein reflects review and annotations entered in association with encounter. Interpretation of such information and data should be left to medically-trained personnel. Information provided to patient can be located elsewhere in the medical record under "Patient Instructions". Document created using STT-dictation technology, any transcriptional errors that may result from process are unintentional.    Patient: Shannon Cortez  Service Category: E/M  Provider: Edward Jolly, MD  DOB: Oct 02, 1965  DOS: 01/15/2023  Specialty: Interventional Pain Management  MRN: [id number]  Setting: Ambulatory outpatient  PCP: Marisue Ivan, MD  Type: Established Patient    Referring Provider: Marisue Ivan, MD  Location: Office  Delivery: Face-to-face     HPI  Shannon Cortez, a 57 y.o. year old female, is here today because of her Chronic pain syndrome [G89.4]. Ms. Craige primary complain today is Shoulder Pain (Left ), Neck Pain (Left ), and Back Pain (Lumbar left )  Last encounter: My last encounter with her was on 10/28/2022  Pertinent problems: Ms. Tesfay has Cellulitis; Chronic pain syndrome; Hx of fusion of cervical spine; Neck pain; and Myofascial pain syndrome on their pertinent problem list. Pain Assessment: Severity of Chronic pain is reported as a 5 /10. Location: Shoulder (left neck and left lumbar) Left/back pain into left hip and leg. Onset: More than a month ago. Quality: Discomfort, Constant, Dull, Sharp, Numbness, Stabbing, Burning. Timing: Constant. Modifying factor(s): rest flat with knees elevated with pillow in the dark.  medications. Vitals:  height is 5\' 2"  (1.575 m) and weight is 199 lb (90.3 kg). Her temporal temperature is 97.3 F (36.3 C) (abnormal). Her blood pressure is 141/73 (abnormal) and her pulse is 98. Her respiration is 16 and oxygen saturation is 97%.   Reason for encounter: medication management.   No change in medical  history since last visit.  Patient's pain is at baseline.  Patient continues multimodal pain regimen as prescribed.  States that it provides pain relief and improvement in functional status. Has been dealing with increased muscle spasms.  Pharmacotherapy Assessment  Analgesic: Percocet 5 mg daily as needed, quantity 60 last 3 months    Monitoring: [place] PMP: PDMP reviewed during this encounter.       Pharmacotherapy: No side-effects or adverse reactions reported. Compliance: No problems identified. Effectiveness: Clinically acceptable.  Vernie Ammons, RN  01/15/2023  8:42 AM  Sign when Signing Visit Nursing Pain Medication Assessment:  Safety precautions to be maintained throughout the outpatient stay will include: orient to surroundings, keep bed in low position, maintain call bell within reach at all times, provide assistance with transfer out of bed and ambulation.  Medication Inspection Compliance: Pill count conducted under aseptic conditions, in front of the patient. Neither the pills nor the bottle was removed from the patient's sight at any time. Once count was completed pills were immediately returned to the patient in their original bottle.  Medication: Oxycodone/APAP Pill/Patch Count:  8 of 60 pills remain Pill/Patch Appearance: Markings consistent with prescribed medication Bottle Appearance: Standard pharmacy container. Clearly labeled. Filled Date: 06 / 25 / 2024 Last Medication intake:  Yesterday     UDS:  Summary  Date Value Ref Range Status  10/28/2022 Note  Final    Comment:    ==================================================================== [employer] Select 13 (MW) ==================================================================== Test                             Result       Flag  Units  Drug Present   Oxycodone                      116                     ng/mg creat   Noroxycodone                   214                     ng/mg creat    Sources  of oxycodone include scheduled prescription medications.    Noroxycodone is an expected metabolite of oxycodone.  ==================================================================== Test                      Result    Flag   Units      Ref Range   Creatinine              121              mg/dL      >=62 ==================================================================== Declared Medications:  Medication list was not provided. ==================================================================== For clinical consultation, please call [phone]. ====================================================================      ROS  Constitutional: Denies any fever or chills Gastrointestinal: No reported hemesis, hematochezia, vomiting, or acute GI distress Musculoskeletal:  cervicalgia Neurological: No reported episodes of acute onset apraxia, aphasia, dysarthria, agnosia, amnesia, paralysis, loss of coordination, or loss of consciousness  Medication Review  NON FORMULARY, albuterol, amLODipine, atorvastatin, diphenhydrAMINE, escitalopram, ibuprofen, losartan, methocarbamol, oxyCODONE-acetaminophen, and traZODone  History Review  Allergy: Shannon Cortez is allergic to hyronan [sodium hyaluronate & lidocaine], toradol [ketorolac tromethamine], and sulfa antibiotics. Drug: Shannon Cortez  reports no history of drug use. Alcohol:  reports no history of alcohol use. Tobacco:  reports that she quit smoking about 8 years ago. Her smoking use included cigarettes. She has never used smokeless tobacco. Social: Shannon Cortez  reports that she quit smoking about 8 years ago. Her smoking use included cigarettes. She has never used smokeless tobacco. She reports that she does not drink alcohol and does not use drugs. Medical:  has a past medical history of DDD (degenerative disc disease), lumbosacral, Hyperlipidemia, and Hypertension. Surgical: Ms. Higham  has a past surgical history that includes Neck  surgery; Abdominal hysterectomy; and Nasal reconstruction with septal repair. Family: family history includes Hepatitis in her mother.  Laboratory Chemistry Profile   Renal Lab Results  Component Value Date   BUN 13 04/18/2018   CREATININE 0.88 04/18/2018   GFRAA >60 04/18/2018   GFRNONAA >60 04/18/2018    Hepatic Lab Results  Component Value Date   AST 22 04/18/2018   ALT 21 04/18/2018   ALBUMIN 3.6 04/18/2018   ALKPHOS 94 04/18/2018   LIPASE 33 04/18/2018    Electrolytes Lab Results  Component Value Date   NA 139 04/18/2018   K 3.6 04/18/2018   CL 106 04/18/2018   CALCIUM 8.7 (L) 04/18/2018    Bone No results found for: "VD25OH", "VD125OH2TOT", "NG2952WU1", "LK4401UU7", "25OHVITD1", "25OHVITD2", "25OHVITD3", "TESTOFREE", "TESTOSTERONE"  Inflammation (CRP: Acute Phase) (ESR: Chronic Phase) Lab Results  Component Value Date   LATICACIDVEN 1.13 04/16/2018         Note: Above Lab results reviewed.  Recent Imaging Review  MM 3D SCREENING MAMMOGRAM BILATERAL BREAST CLINICAL DATA:  Screening.  EXAM: DIGITAL SCREENING BILATERAL MAMMOGRAM WITH TOMOSYNTHESIS AND CAD  TECHNIQUE: Bilateral screening digital craniocaudal and mediolateral oblique mammograms  were obtained. Bilateral screening digital breast tomosynthesis was performed. The images were evaluated with computer-aided detection.  COMPARISON:  None available.  ACR Breast Density Category a: The breasts are almost entirely fatty.  FINDINGS: There are no findings suspicious for malignancy.  IMPRESSION: No mammographic evidence of malignancy. A result letter of this screening mammogram will be mailed directly to the patient.  RECOMMENDATION: Screening mammogram in one year. (Code:SM-B-01Y)  BI-RADS CATEGORY  1: Negative.  Electronically Signed   By: Frederico Hamman M.D.   On: 09/30/2022 12:18  Note: Reviewed        Physical Exam  General appearance: Well nourished, well developed, and well  hydrated. In no apparent acute distress Mental status: Alert, oriented x 3 (person, place, & time)       Respiratory: No evidence of acute respiratory distress Eyes: PERLA Vitals: BP (!) 141/73 (BP Location: Right Arm, Patient Position: Sitting, Cuff Size: Normal)   Pulse 98   Temp (!) 97.3 F (36.3 C) (Temporal)   Resp 16   Ht 5\' 2"  (1.575 m)   Wt 199 lb (90.3 kg)   SpO2 97%   BMI 36.40 kg/m  BMI: Estimated body mass index is 36.4 kg/m as calculated from the following:   Height as of this encounter: 5\' 2"  (1.575 m).   Weight as of this encounter: 199 lb (90.3 kg). Ideal: Ideal body weight: 50.1 kg (110 lb 7.2 oz) Adjusted ideal body weight: 66.2 kg (145 lb 13.9 oz)  Cervical spine pain Limited cervical extension Surgical scar present 5 out of 5 strength bilateral upper extremity: Shoulder abduction, elbow flexion, elbow extension, thumb extension.   Assessment   Status Diagnosis  Controlled Controlled Controlled 1. Chronic pain syndrome   2. Hx of fusion of cervical spine   3. Neck pain          Plan of Care    Ms. Litia Tadesse has a current medication list which includes the following long-term medication(s): albuterol, amlodipine, atorvastatin, diphenhydramine, escitalopram, losartan, losartan, and trazodone.  Pharmacotherapy (Medications Ordered): Meds ordered this encounter  Medications   oxyCODONE-acetaminophen (PERCOCET) 5-325 MG tablet    Sig: Take 1 tablet by mouth every 12 (twelve) hours as needed for severe pain. Must last 30 days.    Dispense:  60 tablet    Refill:  0    Chronic Pain: STOP Act (Not applicable) Fill 1 day early if closed on refill date. Avoid benzodiazepines within 8 hours of opioids   methocarbamol (ROBAXIN) 750 MG tablet    Sig: Take 1 tablet (750 mg total) by mouth every 8 (eight) hours as needed for muscle spasms.    Dispense:  90 tablet    Refill:  2    Do not place this medication, or any other prescription from our  practice, on "Automatic Refill". Patient may have prescription filled one day early if pharmacy is closed on scheduled refill date.  Increased Robaxin from 500 every 8 hours as needed to 750 as needed  Orders:  No orders of the defined types were placed in this encounter.   Follow-up plan:   Return in about 14 weeks (around 04/23/2023) for MM, F2F.    Recent Visits Date Type Provider Dept  10/28/22 Office Visit Edward Jolly, MD [facility]  Showing recent visits within past 90 days and meeting all other requirements Today's Visits Date Type Provider Dept  01/15/23 Office Visit Edward Jolly, MD [facility]  Showing today's visits and meeting all  other requirements Future Appointments No visits were found meeting these conditions. Showing future appointments within next 90 days and meeting all other requirements  I discussed the assessment and treatment plan with the patient. The patient was provided an opportunity to ask questions and all were answered. The patient agreed with the plan and demonstrated an understanding of the instructions.  Patient advised to call back or seek an in-person evaluation if the symptoms or condition worsens.  Duration of encounter: .  Note by: Edward Jolly, MD Date: 01/15/2023; Time: 9:16 AM

## 2023-01-20 ENCOUNTER — Encounter: Payer: BC Managed Care – PPO | Admitting: Student in an Organized Health Care Education/Training Program

## 2023-01-27 DIAGNOSIS — J310 Chronic rhinitis: Secondary | ICD-10-CM | POA: Insufficient documentation

## 2023-03-13 ENCOUNTER — Other Ambulatory Visit: Payer: Self-pay

## 2023-03-13 ENCOUNTER — Emergency Department: Payer: BC Managed Care – PPO

## 2023-03-13 ENCOUNTER — Emergency Department
Admission: EM | Admit: 2023-03-13 | Discharge: 2023-03-14 | Disposition: A | Discharge status: Home / Self Care | Payer: BC Managed Care – PPO | Attending: Emergency Medicine | Admitting: Emergency Medicine

## 2023-03-13 DIAGNOSIS — S93601A Unspecified sprain of right foot, initial encounter: Secondary | ICD-10-CM | POA: Insufficient documentation

## 2023-03-13 DIAGNOSIS — S93431A Sprain of tibiofibular ligament of right ankle, initial encounter: Secondary | ICD-10-CM | POA: Insufficient documentation

## 2023-03-13 DIAGNOSIS — S93491A Sprain of other ligament of right ankle, initial encounter: Secondary | ICD-10-CM

## 2023-03-13 DIAGNOSIS — W1830XA Fall on same level, unspecified, initial encounter: Secondary | ICD-10-CM | POA: Diagnosis not present

## 2023-03-13 DIAGNOSIS — S99921A Unspecified injury of right foot, initial encounter: Secondary | ICD-10-CM | POA: Diagnosis present

## 2023-03-13 NOTE — ED Triage Notes (Signed)
Pt sts that she fell this AM and thinks that she broke her toes on her right foot.

## 2023-03-14 NOTE — ED Notes (Signed)
Patient discharged at this time. Ambulated to lobby with independent and steady gait. Breathing unlabored speaking in full sentences. Verbalized understanding of all discharge, follow up, and medication teaching. Discharged homed with all belongings.   

## 2023-03-14 NOTE — Discharge Instructions (Addendum)
Please take Tylenol and ibuprofen/Advil for your pain.  It is safe to take them together, or to alternate them every few hours.  Take up to 1000mg  of Tylenol at a time, up to 4 times per day.  Do not take more than 4000 mg of Tylenol in 24 hours.  For ibuprofen, take 400-600 mg, 3 - 4 times per day.  Rest, ice, elevation and compression with the boot.  This should get better after a week or 2.  If it does not seem to be improving, then you can reach out to the podiatrist/foot doctor whose number is attached

## 2023-03-14 NOTE — ED Provider Notes (Signed)
[facility] Provider Note    Event Date/Time   First MD Initiated Contact with Patient 03/14/23 0001     (approximate)   History   Foot Injury   HPI  Shannon Cortez is a 57 y.o. female who presents to the ED for evaluation of Foot Injury   Patient presents about 12-14 hours after an accidental foot and ankle injury.  She reports inverting her foot and toes on the right side this morning while feeding her cat.  Reports pain to the anterior and lateral aspect of her ankle but mostly pain to her great toe dorsally.   Physical Exam   Triage Vital Signs: ED Triage Vitals  Encounter Vitals Group     BP 03/13/23 1944 135/71     Systolic BP Percentile --      Diastolic BP Percentile --      Pulse Rate 03/13/23 1944 100     Resp 03/13/23 1944 19     Temp 03/13/23 1944 98.1 F (36.7 C)     Temp Source 03/13/23 1944 Oral     SpO2 03/13/23 1944 98 %     Weight 03/13/23 1943 201 lb (91.2 kg)     Height 03/13/23 1943 5\' 2"  (1.575 m)     Head Circumference --      Peak Flow --      Pain Score 03/13/23 1943 7     Pain Loc --      Pain Education --      Exclude from Growth Chart --     Most recent vital signs: Vitals:   03/13/23 1944  BP: 135/71  Pulse: 100  Resp: 19  Temp: 98.1 F (36.7 C)  SpO2: 98%    General: Awake, no distress.  CV:  Good peripheral perfusion.  Resp:  Normal effort.  Abd:  No distention.  MSK:   Mild closed soft tissue swelling throughout the right foot and ankle, tenderness of the right anterior talofibular ligament and suspect right ankle sprain from this.  Tender to the base of the right great toe dorsally.  No nail injury.  No neurologic or vascular deficits appreciated Neuro:  No focal deficits appreciated. Other:     ED Results / Procedures / Treatments   Labs (all labs ordered are listed, but only abnormal results are displayed) Labs Reviewed - No data to display  EKG   RADIOLOGY Plain film of the right  foot interpreted by me without evidence of fracture or dislocation  Official radiology report(s): DG Foot Complete Right  Result Date: 03/13/2023 CLINICAL DATA:  Injury to right great toe.  Pain. EXAM: RIGHT FOOT COMPLETE - 3+ VIEW COMPARISON:  None Available. FINDINGS: There is no evidence of fracture or dislocation. There is no evidence of arthropathy or other focal bone abnormality. Mild soft tissue edema. IMPRESSION: No fracture or dislocation, with particular attention to the great toe. Mild soft tissue edema. Electronically Signed   By: Narda Rutherford M.D.   On: 03/13/2023 22:34    PROCEDURES and INTERVENTIONS:  Procedures  Medications - No data to display   IMPRESSION / MDM / ASSESSMENT AND PLAN / ED COURSE  I reviewed the triage vital signs and the nursing notes.  Differential diagnosis includes, but is not limited to, toe fracture, dislocation, sprain or strain  Patient presents with evidence of a great toe and anterior talofibular ligament ankle sprain on the right suitable for immobilization and outpatient management.  X-rays reassuring.  Provide  with boot and crutches and podiatry follow-up information      FINAL CLINICAL IMPRESSION(S) / ED DIAGNOSES   Final diagnoses:  Sprain of right foot, initial encounter  Sprain of anterior talofibular ligament of right ankle, initial encounter     Rx / DC Orders   ED Discharge Orders     None        Note:  This document was prepared using Dragon voice recognition software and may include unintentional dictation errors.   Delton Prairie, MD 03/14/23 [phone]

## 2023-04-21 ENCOUNTER — Ambulatory Visit
Payer: BC Managed Care – PPO | Attending: Student in an Organized Health Care Education/Training Program | Admitting: Student in an Organized Health Care Education/Training Program

## 2023-04-21 ENCOUNTER — Encounter: Payer: Self-pay | Admitting: Student in an Organized Health Care Education/Training Program

## 2023-04-21 VITALS — BP 118/78 | HR 96 | Temp 97.4°F | Ht 62.0 in | Wt 200.0 lb

## 2023-04-21 DIAGNOSIS — M542 Cervicalgia: Secondary | ICD-10-CM | POA: Diagnosis not present

## 2023-04-21 DIAGNOSIS — G894 Chronic pain syndrome: Secondary | ICD-10-CM | POA: Diagnosis present

## 2023-04-21 DIAGNOSIS — Z981 Arthrodesis status: Secondary | ICD-10-CM | POA: Diagnosis present

## 2023-04-21 DIAGNOSIS — M7918 Myalgia, other site: Secondary | ICD-10-CM | POA: Diagnosis not present

## 2023-04-21 MED ORDER — OXYCODONE-ACETAMINOPHEN 5-325 MG PO TABS: 1.0000 | ORAL_TABLET | Freq: Two times a day (BID) | ORAL | 0 refills | Status: AC | PRN

## 2023-04-21 NOTE — Progress Notes (Signed)
PROVIDER NOTE: Information contained herein reflects review and annotations entered in association with encounter. Interpretation of such information and data should be left to medically-trained personnel. Information provided to patient can be located elsewhere in the medical record under "Patient Instructions". Document created using STT-dictation technology, any transcriptional errors that may result from process are unintentional.    Patient: Shannon Cortez  Service Category: E/M  Provider: Edward Jolly, MD  DOB: April 26, 1966  DOS: 04/21/2023  Referring Provider: Marisue Ivan, MD  MRN: [id number]  Specialty: Interventional Pain Management  PCP: Marisue Ivan, MD  Type: Established Patient  Setting: Ambulatory outpatient    Location: Office  Delivery: Face-to-face     HPI  [name], a 57 y.o. year old female, is here today because of her Chronic pain syndrome [G89.4]. [name] primary complain today is Neck Pain  Pertinent problems: [name] has Cellulitis; Chronic pain syndrome; Hx of fusion of cervical spine; Neck pain; and Myofascial pain syndrome on their pertinent problem list. Pain Assessment: Severity of Chronic pain is reported as a 5 /10. Location: Neck Left (neck and shoulder)/denies. Onset: More than a month ago. Quality: Burning, Stabbing. Timing: Constant. Modifying factor(s): meds, heat. Vitals:  height is 5\' 2"  (1.575 m) and weight is 200 lb (90.7 kg). Her temporal temperature is 97.4 F (36.3 C) (abnormal). Her blood pressure is 118/78 and her pulse is 96. Her oxygen saturation is 98%.  BMI: Estimated body mass index is 36.58 kg/m as calculated from the following:   Height as of this encounter: 5\' 2"  (1.575 m).   Weight as of this encounter: 200 lb (90.7 kg). Last encounter: 01/15/2023. Last procedure: Visit date not found.  Reason for encounter: medication management.   Patient presents today for medication management.  Since her last clinic  visit with me unfortunately she had a sprain of her right foot which led her to the emergency department on 03/13/2023.  X-rays were done which did not show any acute fractures however she did have significant swelling that was noted on x-ray as well.  She continued with ice compression and elevation which reduce her swelling although she continues to have right ankle pain.  She also has chronic neck pain, history of cervical spinal fusion.  Takes medications very seldomly.  1 prescription usually last 3 months.  Pharmacotherapy Assessment  Analgesic: Percocet 5 mg daily as needed, quantity 60 last 3 months    Monitoring:  PMP: PDMP reviewed during this encounter.       Pharmacotherapy: No side-effects or adverse reactions reported. Compliance: No problems identified. Effectiveness: Clinically acceptable.  Florina Ou, RN  04/21/2023  8:22 AM  Sign when Signing Visit Safety precautions to be maintained throughout the outpatient stay will include: orient to surroundings, keep bed in low position, maintain call bell within reach at all times, provide assistance with transfer out of bed and ambulation.   Nursing Pain Medication Assessment:  Safety precautions to be maintained throughout the outpatient stay will include: orient to surroundings, keep bed in low position, maintain call bell within reach at all times, provide assistance with transfer out of bed and ambulation.  Medication Inspection Compliance: Pill count conducted under aseptic conditions, in front of the patient. Neither the pills nor the bottle was removed from the patient's sight at any time. Once count was completed pills were immediately returned to the patient in their original bottle.  Medication: Oxycodone/APAP Pill/Patch Count:  9.25 of 60 pills remain Pill/Patch Appearance: Markings consistent with  prescribed medication Bottle Appearance: Standard pharmacy container. Clearly labeled. Filled Date: 59 / 24 / 2024 Last  Medication intake:  Yesterday  No results found for: "CBDTHCR" No results found for: "D8THCCBX" No results found for: "D9THCCBX"  UDS:  Summary  Date Value Ref Range Status  10/28/2022 Note  Final    Comment:    ==================================================================== [employer] Select 13 (MW) ==================================================================== Test                             Result       Flag       Units  Drug Present   Oxycodone                      116                     ng/mg creat   Noroxycodone                   214                     ng/mg creat    Sources of oxycodone include scheduled prescription medications.    Noroxycodone is an expected metabolite of oxycodone.  ==================================================================== Test                      Result    Flag   Units      Ref Range   Creatinine              121              mg/dL      >=52 ==================================================================== Declared Medications:  Medication list was not provided. ==================================================================== For clinical consultation, please call [phone]. ====================================================================       ROS  Constitutional: Denies any fever or chills Gastrointestinal: No reported hemesis, hematochezia, vomiting, or acute GI distress Musculoskeletal:  As above Neurological: No reported episodes of acute onset apraxia, aphasia, dysarthria, agnosia, amnesia, paralysis, loss of coordination, or loss of consciousness  Medication Review  NON FORMULARY, albuterol, amLODipine, atorvastatin, cetirizine, diphenhydrAMINE, escitalopram, fluticasone, ibuprofen, losartan, methocarbamol, oxyCODONE-acetaminophen, and traZODone  History Review  Allergy: [name] is allergic to hyronan [sodium hyaluronate & lidocaine], toradol [ketorolac tromethamine], and sulfa  antibiotics. Drug: [name]  reports no history of drug use. Alcohol:  reports no history of alcohol use. Tobacco:  reports that she quit smoking about 8 years ago. Her smoking use included cigarettes. She has never used smokeless tobacco. Social: [name]  reports that she quit smoking about 8 years ago. Her smoking use included cigarettes. She has never used smokeless tobacco. She reports that she does not drink alcohol and does not use drugs. Medical:  has a past medical history of DDD (degenerative disc disease), lumbosacral, Hyperlipidemia, and Hypertension. Surgical: [name]  has a past surgical history that includes Neck surgery; Abdominal hysterectomy; and Nasal reconstruction with septal repair. Family: family history includes Hepatitis in her mother.  Laboratory Chemistry Profile   Renal Lab Results  Component Value Date   BUN 13 04/18/2018   CREATININE 0.88 04/18/2018   GFRAA >60 04/18/2018   GFRNONAA >60 04/18/2018    Hepatic Lab Results  Component Value Date   AST 22 04/18/2018   ALT 21 04/18/2018   ALBUMIN 3.6 04/18/2018   ALKPHOS 94 04/18/2018   LIPASE 33 04/18/2018  Electrolytes Lab Results  Component Value Date   NA 139 04/18/2018   K 3.6 04/18/2018   CL 106 04/18/2018   CALCIUM 8.7 (L) 04/18/2018    Bone No results found for: "VD25OH", "VD125OH2TOT", "QI6962XB2", "WU1324MW1", "25OHVITD1", "25OHVITD2", "25OHVITD3", "TESTOFREE", "TESTOSTERONE"  Inflammation (CRP: Acute Phase) (ESR: Chronic Phase) Lab Results  Component Value Date   LATICACIDVEN 1.13 04/16/2018         Note: Above Lab results reviewed.  Recent Imaging Review  DG Foot Complete Right CLINICAL DATA:  Injury to right great toe.  Pain.  EXAM: RIGHT FOOT COMPLETE - 3+ VIEW  COMPARISON:  None Available.  FINDINGS: There is no evidence of fracture or dislocation. There is no evidence of arthropathy or other focal bone abnormality. Mild soft tissue  edema.  IMPRESSION: No fracture or dislocation, with particular attention to the great toe.  Mild soft tissue edema.  Electronically Signed   By: Narda Rutherford M.D.   On: 03/13/2023 22:34 Note: Reviewed        Physical Exam  General appearance: Well nourished, well developed, and well hydrated. In no apparent acute distress Mental status: Alert, oriented x 3 (person, place, & time)       Respiratory: No evidence of acute respiratory distress Eyes: PERLA Vitals: BP 118/78   Pulse 96   Temp (!) 97.4 F (36.3 C) (Temporal)   Ht 5\' 2"  (1.575 m)   Wt 200 lb (90.7 kg)   SpO2 98%   BMI 36.58 kg/m  BMI: Estimated body mass index is 36.58 kg/m as calculated from the following:   Height as of this encounter: 5\' 2"  (1.575 m).   Weight as of this encounter: 200 lb (90.7 kg). Ideal: Ideal body weight: 50.1 kg (110 lb 7.2 oz) Adjusted ideal body weight: 66.3 kg (146 lb 4.3 oz)  Assessment   Diagnosis Status  1. Chronic pain syndrome   2. Hx of fusion of cervical spine   3. Neck pain   4. Myofascial pain syndrome    Controlled Controlled Controlled    Plan of Care  .  Toxicology screen up-to-date Continue with elevation and compression to help with the right foot sprain   Pharmacotherapy (Medications Ordered): Meds ordered this encounter  Medications   oxyCODONE-acetaminophen (PERCOCET) 5-325 MG tablet    Sig: Take 1 tablet by mouth every 12 (twelve) hours as needed for severe pain (pain score 7-10). Must last 30 days.    Dispense:  60 tablet    Refill:  0    Chronic Pain: STOP Act (Not applicable) Fill 1 day early if closed on refill date. Avoid benzodiazepines within 8 hours of opioids   Orders:  No orders of the defined types were placed in this encounter.  Follow-up plan:   Return in about 3 months (around 07/20/2023) for MM, F2F.     Recent Visits No visits were found meeting these conditions. Showing recent visits within past 90 days and meeting all other  requirements Today's Visits Date Type Provider Dept  04/21/23 Office Visit Edward Jolly, MD [facility]  Showing today's visits and meeting all other requirements Future Appointments Date Type Provider Dept  07/16/23 Appointment Edward Jolly, MD [facility]  Showing future appointments within next 90 days and meeting all other requirements  I discussed the assessment and treatment plan with the patient. The patient was provided an opportunity to ask questions and all were answered. The patient agreed with the plan and demonstrated an understanding of  the instructions.  Patient advised to call back or seek an in-person evaluation if the symptoms or condition worsens.  Duration of encounter: 30 minutes.  Total time on encounter, as per AMA guidelines included both the face-to-face and non-face-to-face time personally spent by the physician and/or other qualified health care professional(s) on the day of the encounter (includes time in activities that require the physician or other qualified health care professional and does not include time in activities normally performed by clinical staff). Physician's time may include the following activities when performed: Preparing to see the patient (e.g., pre-charting review of records, searching for previously ordered imaging, lab work, and nerve conduction tests) Review of prior analgesic pharmacotherapies. Reviewing PMP Interpreting ordered tests (e.g., lab work, imaging, nerve conduction tests) Performing post-procedure evaluations, including interpretation of diagnostic procedures Obtaining and/or reviewing separately obtained history Performing a medically appropriate examination and/or evaluation Counseling and educating the patient/family/caregiver Ordering medications, tests, or procedures Referring and communicating with other health care professionals (when not separately reported) Documenting clinical information in  the electronic or other health record Independently interpreting results (not separately reported) and communicating results to the patient/ family/caregiver Care coordination (not separately reported)  Note by: Edward Jolly, MD Date: 04/21/2023; Time: 9:05 AM

## 2023-04-21 NOTE — Progress Notes (Signed)
Safety precautions to be maintained throughout the outpatient stay will include: orient to surroundings, keep bed in low position, maintain call bell within reach at all times, provide assistance with transfer out of bed and ambulation.   Nursing Pain Medication Assessment:  Safety precautions to be maintained throughout the outpatient stay will include: orient to surroundings, keep bed in low position, maintain call bell within reach at all times, provide assistance with transfer out of bed and ambulation.  Medication Inspection Compliance: Pill count conducted under aseptic conditions, in front of the patient. Neither the pills nor the bottle was removed from the patient's sight at any time. Once count was completed pills were immediately returned to the patient in their original bottle.  Medication: Oxycodone/APAP Pill/Patch Count:  9.25 of 60 pills remain Pill/Patch Appearance: Markings consistent with prescribed medication Bottle Appearance: Standard pharmacy container. Clearly labeled. Filled Date: 43 / 24 / 2024 Last Medication intake:  Yesterday

## 2023-05-04 ENCOUNTER — Telehealth: Payer: Self-pay | Admitting: Student in an Organized Health Care Education/Training Program

## 2023-05-04 ENCOUNTER — Other Ambulatory Visit: Payer: Self-pay

## 2023-05-04 DIAGNOSIS — M542 Cervicalgia: Secondary | ICD-10-CM

## 2023-05-04 DIAGNOSIS — G894 Chronic pain syndrome: Secondary | ICD-10-CM

## 2023-05-04 DIAGNOSIS — Z981 Arthrodesis status: Secondary | ICD-10-CM

## 2023-05-04 MED ORDER — METHOCARBAMOL 750 MG PO TABS: 750.0000 mg | ORAL_TABLET | Freq: Three times a day (TID) | ORAL | 2 refills | Status: DC | PRN

## 2023-05-04 NOTE — Telephone Encounter (Signed)
PT called stated that when she went to pharmacy to pick up meds, Lateef didn't place an refill on her methocarbamol. Please give patient a call. TY

## 2023-07-16 ENCOUNTER — Ambulatory Visit
Payer: BC Managed Care – PPO | Attending: Student in an Organized Health Care Education/Training Program | Admitting: Student in an Organized Health Care Education/Training Program

## 2023-07-16 ENCOUNTER — Encounter: Payer: Self-pay | Admitting: Student in an Organized Health Care Education/Training Program

## 2023-07-16 DIAGNOSIS — Z981 Arthrodesis status: Secondary | ICD-10-CM | POA: Diagnosis present

## 2023-07-16 DIAGNOSIS — M542 Cervicalgia: Secondary | ICD-10-CM | POA: Insufficient documentation

## 2023-07-16 DIAGNOSIS — G894 Chronic pain syndrome: Secondary | ICD-10-CM | POA: Diagnosis present

## 2023-07-16 MED ORDER — METHOCARBAMOL 750 MG PO TABS: 750.0000 mg | ORAL_TABLET | Freq: Three times a day (TID) | ORAL | 5 refills | Status: DC | PRN

## 2023-07-16 MED ORDER — OXYCODONE-ACETAMINOPHEN 5-325 MG PO TABS: 1.0000 | ORAL_TABLET | Freq: Two times a day (BID) | ORAL | 0 refills | Status: AC | PRN

## 2023-07-16 NOTE — Progress Notes (Signed)
 Nursing Pain Medication Assessment:  Safety precautions to be maintained throughout the outpatient stay will include: orient to surroundings, keep bed in low position, maintain call bell within reach at all times, provide assistance with transfer out of bed and ambulation.  Medication Inspection Compliance: Pill count conducted under aseptic conditions, in front of the patient. Neither the pills nor the bottle was removed from the patient's sight at any time. Once count was completed pills were immediately returned to the patient in their original bottle.  Medication: Oxycodone/APAP Pill/Patch Count:  9.25 of 60 pills remain Pill/Patch Appearance: Markings consistent with prescribed medication Bottle Appearance: Standard pharmacy container. Clearly labeled. Filled Date: 64 / 24 / 2024 Last Medication intake:  Yesterday

## 2023-07-16 NOTE — Progress Notes (Signed)
 PROVIDER NOTE: Information contained herein reflects review and annotations entered in association with encounter. Interpretation of such information and data should be left to medically-trained personnel. Information provided to patient can be located elsewhere in the medical record under "Patient Instructions". Document created using STT-dictation technology, any transcriptional errors that may result from process are unintentional.    Patient: Shannon Cortez  Service Category: E/M  Provider: Edward Jolly, MD  DOB: 12-Feb-1966  DOS: 07/16/2023  Referring Provider: Marisue Ivan, MD  MRN: [id number]  Specialty: Interventional Pain Management  PCP: Marisue Ivan, MD  Type: Established Patient  Setting: Ambulatory outpatient    Location: Office  Delivery: Face-to-face     HPI  [name], a 58 y.o. year old female, is here today because of her No primary diagnosis found.. [name] primary complain today is Neck Pain and Back Pain (lower)  Pertinent problems: [name] has Cellulitis; Chronic pain syndrome; Hx of fusion of cervical spine; Neck pain; and Myofascial pain syndrome on their pertinent problem list. Pain Assessment: Severity of Chronic pain is reported as a 5 /10. Location: Neck Left/to left shoulder and left upper arm. Onset: More than a month ago. Quality: Stabbing, Aching, Burning, Throbbing. Timing: Constant. Modifying factor(s): meds. Vitals:  height is 5\' 2"  (1.575 m) and weight is 196 lb (88.9 kg). Her temperature is 97.2 F (36.2 C) (abnormal). Her blood pressure is 119/74 and her pulse is 71. Her respiration is 16 and oxygen saturation is 100%.  BMI: Estimated body mass index is 35.85 kg/m as calculated from the following:   Height as of this encounter: 5\' 2"  (1.575 m).   Weight as of this encounter: 196 lb (88.9 kg). Last encounter: 04/21/23 Last procedure: Visit date not found.  Reason for encounter: medication management.   No change in medical  history since last visit.  Patient's pain is at baseline.  Patient continues multimodal pain regimen as prescribed.  States that it provides pain relief and improvement in functional status.  Pharmacotherapy Assessment  Analgesic: Percocet 5 mg daily as needed, quantity 60 last 3 months    Monitoring: [place] PMP: PDMP not reviewed this encounter.       Pharmacotherapy: No side-effects or adverse reactions reported. Compliance: No problems identified. Effectiveness: Clinically acceptable.  Nonah Mattes, RN  07/16/2023  8:25 AM  Sign when Signing Visit Nursing Pain Medication Assessment:  Safety precautions to be maintained throughout the outpatient stay will include: orient to surroundings, keep bed in low position, maintain call bell within reach at all times, provide assistance with transfer out of bed and ambulation.  Medication Inspection Compliance: Pill count conducted under aseptic conditions, in front of the patient. Neither the pills nor the bottle was removed from the patient's sight at any time. Once count was completed pills were immediately returned to the patient in their original bottle.  Medication: Oxycodone/APAP Pill/Patch Count:  9.25 of 60 pills remain Pill/Patch Appearance: Markings consistent with prescribed medication Bottle Appearance: Standard pharmacy container. Clearly labeled. Filled Date: 63 / 24 / 2024 Last Medication intake:  Yesterday  No results found for: "CBDTHCR" No results found for: "D8THCCBX" No results found for: "D9THCCBX"  UDS:  Summary  Date Value Ref Range Status  10/28/2022 Note  Final    Comment:    ==================================================================== Tox[employer] Select 13 (MW) ==================================================================== Test  Result       Flag       Units  Drug Present   Oxycodone                      116                     ng/mg creat   Noroxycodone                   214                      ng/mg creat    Sources of oxycodone include scheduled prescription medications.    Noroxycodone is an expected metabolite of oxycodone.  ==================================================================== Test                      Result    Flag   Units      Ref Range   Creatinine              121              mg/dL      >=16 ==================================================================== Declared Medications:  Medication list was not provided. ==================================================================== For clinical consultation, please call [phone]. ====================================================================       ROS  Constitutional: Denies any fever or chills Gastrointestinal: No reported hemesis, hematochezia, vomiting, or acute GI distress Musculoskeletal:  Cervicalgia and low back pain Neurological: No reported episodes of acute onset apraxia, aphasia, dysarthria, agnosia, amnesia, paralysis, loss of coordination, or loss of consciousness  Medication Review  NON FORMULARY, albuterol, amLODipine, atorvastatin, cetirizine, diphenhydrAMINE, escitalopram, fluticasone, ibuprofen, losartan, methocarbamol, oxyCODONE-acetaminophen, and traZODone  History Review  Allergy: [name] is allergic to hyronan [sodium hyaluronate & lidocaine], toradol [ketorolac tromethamine], and sulfa antibiotics. Drug: [name]  reports no history of drug use. Alcohol:  reports no history of alcohol use. Tobacco:  reports that she quit smoking about 9 years ago. Her smoking use included cigarettes. She has never used smokeless tobacco. Social: [name]  reports that she quit smoking about 9 years ago. Her smoking use included cigarettes. She has never used smokeless tobacco. She reports that she does not drink alcohol and does not use drugs. Medical:  has a past medical history of DDD (degenerative disc disease), lumbosacral, Hyperlipidemia,  and Hypertension. Surgical: [name]  has a past surgical history that includes Neck surgery; Abdominal hysterectomy; and Nasal reconstruction with septal repair. Family: family history includes Hepatitis in her mother.  Laboratory Chemistry Profile   Renal Lab Results  Component Value Date   BUN 13 04/18/2018   CREATININE 0.88 04/18/2018   GFRAA >60 04/18/2018   GFRNONAA >60 04/18/2018    Hepatic Lab Results  Component Value Date   AST 22 04/18/2018   ALT 21 04/18/2018   ALBUMIN 3.6 04/18/2018   ALKPHOS 94 04/18/2018   LIPASE 33 04/18/2018    Electrolytes Lab Results  Component Value Date   NA 139 04/18/2018   K 3.6 04/18/2018   CL 106 04/18/2018   CALCIUM 8.7 (L) 04/18/2018    Bone No results found for: "VD25OH", "VD125OH2TOT", "WJ1914NW2", "NF6213YQ6", "25OHVITD1", "25OHVITD2", "25OHVITD3", "TESTOFREE", "TESTOSTERONE"  Inflammation (CRP: Acute Phase) (ESR: Chronic Phase) Lab Results  Component Value Date   LATICACIDVEN 1.13 04/16/2018         Note: Above Lab results reviewed.  Recent Imaging Review  DG Foot Complete Right CLINICAL DATA:  Injury to  right great toe.  Pain.  EXAM: RIGHT FOOT COMPLETE - 3+ VIEW  COMPARISON:  None Available.  FINDINGS: There is no evidence of fracture or dislocation. There is no evidence of arthropathy or other focal bone abnormality. Mild soft tissue edema.  IMPRESSION: No fracture or dislocation, with particular attention to the great toe.  Mild soft tissue edema.  Electronically Signed   By: Narda Rutherford M.D.   On: 03/13/2023 22:34 Note: Reviewed        Physical Exam  General appearance: Well nourished, well developed, and well hydrated. In no apparent acute distress Mental status: Alert, oriented x 3 (person, place, & time)       Respiratory: No evidence of acute respiratory distress Eyes: PERLA Vitals: BP 119/74   Pulse 71   Temp (!) 97.2 F (36.2 C)   Resp 16   Ht 5\' 2"  (1.575 m)   Wt 196 lb  (88.9 kg)   SpO2 100%   BMI 35.85 kg/m  BMI: Estimated body mass index is 35.85 kg/m as calculated from the following:   Height as of this encounter: 5\' 2"  (1.575 m).   Weight as of this encounter: 196 lb (88.9 kg). Ideal: Ideal body weight: 50.1 kg (110 lb 7.2 oz) Adjusted ideal body weight: 65.6 kg (144 lb 10.7 oz)  Assessment   Diagnosis Status  1. Chronic pain syndrome   2. Hx of fusion of cervical spine   3. Neck pain    Controlled Controlled Controlled    Plan of Care  UDS up-to-date and appropriate   Pharmacotherapy (Medications Ordered): Meds ordered this encounter  Medications   methocarbamol (ROBAXIN) 750 MG tablet    Sig: Take 1 tablet (750 mg total) by mouth every 8 (eight) hours as needed for muscle spasms.    Dispense:  90 tablet    Refill:  5    Do not place this medication, or any other prescription from our practice, on "Automatic Refill". Patient may have prescription filled one day early if pharmacy is closed on scheduled refill date.   oxyCODONE-acetaminophen (PERCOCET/ROXICET) 5-325 MG tablet    Sig: Take 1 tablet by mouth 2 (two) times daily as needed for severe pain (pain score 7-10).    Dispense:  60 tablet    Refill:  0   Orders:  No orders of the defined types were placed in this encounter.  Follow-up plan:   Return in about 3 months (around 10/16/2023) for MM, F2F.     Recent Visits Date Type Provider Dept  04/21/23 Office Visit Edward Jolly, MD [facility]  Showing recent visits within past 90 days and meeting all other requirements Today's Visits Date Type Provider Dept  07/16/23 Office Visit Edward Jolly, MD [facility]  Showing today's visits and meeting all other requirements Future Appointments Date Type Provider Dept  10/06/23 Appointment Edward Jolly, MD [facility]  Showing future appointments within next 90 days and meeting all other requirements  I discussed the assessment and treatment  plan with the patient. The patient was provided an opportunity to ask questions and all were answered. The patient agreed with the plan and demonstrated an understanding of the instructions.  Patient advised to call back or seek an in-person evaluation if the symptoms or condition worsens.  Duration of encounter: 30 minutes.  Total time on encounter, as per AMA guidelines included both the face-to-face and non-face-to-face time personally spent by the physician and/or other qualified health care professional(s) on  the day of the encounter (includes time in activities that require the physician or other qualified health care professional and does not include time in activities normally performed by clinical staff). Physician's time may include the following activities when performed: Preparing to see the patient (e.g., pre-charting review of records, searching for previously ordered imaging, lab work, and nerve conduction tests) Review of prior analgesic pharmacotherapies. Reviewing PMP Interpreting ordered tests (e.g., lab work, imaging, nerve conduction tests) Performing post-procedure evaluations, including interpretation of diagnostic procedures Obtaining and/or reviewing separately obtained history Performing a medically appropriate examination and/or evaluation Counseling and educating the patient/family/caregiver Ordering medications, tests, or procedures Referring and communicating with other health care professionals (when not separately reported) Documenting clinical information in the electronic or other health record Independently interpreting results (not separately reported) and communicating results to the patient/ family/caregiver Care coordination (not separately reported)  Note by: Edward Jolly, MD Date: 07/16/2023; Time: 8:57 AM

## 2023-10-06 ENCOUNTER — Ambulatory Visit
Attending: Student in an Organized Health Care Education/Training Program | Admitting: Student in an Organized Health Care Education/Training Program

## 2023-10-06 ENCOUNTER — Encounter: Payer: Self-pay | Admitting: Student in an Organized Health Care Education/Training Program

## 2023-10-06 VITALS — BP 125/62 | HR 93 | Temp 97.3°F | Resp 16 | Ht 62.5 in | Wt 195.0 lb

## 2023-10-06 DIAGNOSIS — M542 Cervicalgia: Secondary | ICD-10-CM | POA: Diagnosis present

## 2023-10-06 DIAGNOSIS — Z981 Arthrodesis status: Secondary | ICD-10-CM | POA: Diagnosis present

## 2023-10-06 DIAGNOSIS — G894 Chronic pain syndrome: Secondary | ICD-10-CM

## 2023-10-06 DIAGNOSIS — M7918 Myalgia, other site: Secondary | ICD-10-CM | POA: Diagnosis not present

## 2023-10-06 DIAGNOSIS — Z9889 Other specified postprocedural states: Secondary | ICD-10-CM | POA: Diagnosis present

## 2023-10-06 MED ORDER — OXYCODONE-ACETAMINOPHEN 5-325 MG PO TABS: 1.0000 | ORAL_TABLET | Freq: Two times a day (BID) | ORAL | 0 refills | Status: AC | PRN

## 2023-10-06 NOTE — Progress Notes (Signed)
 PROVIDER NOTE: Information contained herein reflects review and annotations entered in association with encounter. Interpretation of such information and data should be left to medically-trained personnel. Information provided to patient can be located elsewhere in the medical record under "Patient Instructions". Document created using STT-dictation technology, any transcriptional errors that may result from process are unintentional.    Patient: Shannon Cortez  Service Category: E/M  Provider: Cephus Collin, MD  DOB: August 18, 1965  DOS: 10/06/2023  Referring Provider: Monique Ano, MD  MRN: [id number]  Specialty: Interventional Pain Management  PCP: Monique Ano, MD  Type: Established Patient  Setting: Ambulatory outpatient    Location: Office  Delivery: Face-to-face     HPI  Shannon Cortez, a 58 y.o. year old female, is here today because of her Chronic pain syndrome [G89.4]. Shannon Cortez primary complain today is Neck Pain  Pertinent problems: Shannon Cortez has Cellulitis; Chronic pain syndrome; Hx of fusion of cervical spine; Neck pain; and Myofascial pain syndrome on their pertinent problem list. Pain Assessment: Severity of Chronic pain is reported as a 5 /10. Location: Neck Left/left shoulder. Onset: More than a month ago. Quality: Aching, Burning, Throbbing, Tender. Timing: Constant. Modifying factor(s): meds, tennis ball, rest, heat. Vitals:  height is 5' 2.5" (1.588 m) and weight is 195 lb (88.5 kg). Her temperature is 97.3 F (36.3 C) (abnormal). Her blood pressure is 125/62 and her pulse is 93. Her respiration is 16 and oxygen saturation is 99%.  BMI: Estimated body mass index is 35.1 kg/m as calculated from the following:   Height as of this encounter: 5' 2.5" (1.588 m).   Weight as of this encounter: 195 lb (88.5 kg). Last encounter: 04/21/23 Last procedure: Visit date not found.  Reason for encounter: medication management.   No change in medical history since  last visit.  Patient's pain is at baseline.  Patient continues multimodal pain regimen as prescribed.  States that it provides pain relief and improvement in functional status.  Pharmacotherapy Assessment  Analgesic: Percocet 5 mg daily as needed, quantity 60 last 3 months    Monitoring: [place] PMP: PDMP not reviewed this encounter.       Pharmacotherapy: No side-effects or adverse reactions reported. Compliance: No problems identified. Effectiveness: Clinically acceptable.  Sibyl Drafts, RN  10/06/2023  8:06 AM  Sign when Signing Visit Nursing Pain Medication Assessment:  Safety precautions to be maintained throughout the outpatient stay will include: orient to surroundings, keep bed in low position, maintain call bell within reach at all times, provide assistance with transfer out of bed and ambulation.  Medication Inspection Compliance: Pill count conducted under aseptic conditions, in front of the patient. Neither the pills nor the bottle was removed from the patient's sight at any time. Once count was completed pills were immediately returned to the patient in their original bottle.  Medication: Oxycodone /APAP Pill/Patch Count: 15/60 pills Pill/Patch Appearance: Markings consistent with prescribed medication Bottle Appearance: Standard pharmacy container. Clearly labeled. Filled Date: 3 / 36 / 2025 Last Medication intake:  TodaySafety precautions to be maintained throughout the outpatient stay will include: orient to surroundings, keep bed in low position, maintain call bell within reach at all times, provide assistance with transfer out of bed and ambulation.   No results found for: "CBDTHCR" No results found for: "D8THCCBX" No results found for: "D9THCCBX"  UDS:  Summary  Date Value Ref Range Status  10/28/2022 Note  Final    Comment:    ==================================================================== ToxASSURE Select 13  (MW) ====================================================================  Test                             Result       Flag       Units  Drug Present   Oxycodone                       116                     ng/mg creat   Noroxycodone                   214                     ng/mg creat    Sources of oxycodone  include scheduled prescription medications.    Noroxycodone is an expected metabolite of oxycodone .  ==================================================================== Test                      Result    Flag   Units      Ref Range   Creatinine              121              mg/dL      >=91 ==================================================================== Declared Medications:  Medication list was not provided. ==================================================================== For clinical consultation, please call [phone]. ====================================================================       ROS  Constitutional: Denies any fever or chills Gastrointestinal: No reported hemesis, hematochezia, vomiting, or acute GI distress Musculoskeletal: Cervicalgia and low back pain Neurological: No reported episodes of acute onset apraxia, aphasia, dysarthria, agnosia, amnesia, paralysis, loss of coordination, or loss of consciousness  Medication Review  DM-Doxylamine-Acetaminophen , NON FORMULARY, albuterol, amLODipine, atorvastatin , cetirizine, diphenhydrAMINE, escitalopram, fluticasone, ibuprofen , losartan, methocarbamol , oxyCODONE -acetaminophen , and traZODone  History Review  Allergy: Shannon Cortez is allergic to hyronan [sodium hyaluronate & lidocaine ], toradol [ketorolac tromethamine], and sulfa antibiotics. Drug: Shannon Cortez  reports no history of drug use. Alcohol:  reports no history of alcohol use. Tobacco:  reports that she quit smoking about 9 years ago. Her smoking use included cigarettes. She has never used smokeless tobacco. Social: Shannon Cortez   reports that she quit smoking about 9 years ago. Her smoking use included cigarettes. She has never used smokeless tobacco. She reports that she does not drink alcohol and does not use drugs. Medical:  has a past medical history of DDD (degenerative disc disease), lumbosacral, Hyperlipidemia, and Hypertension. Surgical: Shannon Cortez  has a past surgical history that includes Neck surgery; Abdominal hysterectomy; and Nasal reconstruction with septal repair. Family: family history includes Hepatitis in her mother.  Laboratory Chemistry Profile   Renal Lab Results  Component Value Date   BUN 13 04/18/2018   CREATININE 0.88 04/18/2018   GFRAA >60 04/18/2018   GFRNONAA >60 04/18/2018    Hepatic Lab Results  Component Value Date   AST 22 04/18/2018   ALT 21 04/18/2018   ALBUMIN 3.6 04/18/2018   ALKPHOS 94 04/18/2018   LIPASE 33 04/18/2018    Electrolytes Lab Results  Component Value Date   NA 139 04/18/2018   K 3.6 04/18/2018   CL 106 04/18/2018   CALCIUM  8.7 (L) 04/18/2018    Bone No results found for: "VD25OH", "VD125OH2TOT", "[id number]", "[id number]", "25OHVITD1", "25OHVITD2", "25OHVITD3", "TESTOFREE", "TESTOSTERONE"  Inflammation (CRP: Acute Phase) (ESR: Chronic Phase) Lab Results  Component Value Date   LATICACIDVEN 1.13  04/16/2018         Note: Above Lab results reviewed.  Recent Imaging Review  DG Foot Complete Right CLINICAL DATA:  Injury to right great toe.  Pain.  EXAM: RIGHT FOOT COMPLETE - 3+ VIEW  COMPARISON:  None Available.  FINDINGS: There is no evidence of fracture or dislocation. There is no evidence of arthropathy or other focal bone abnormality. Mild soft tissue edema.  IMPRESSION: No fracture or dislocation, with particular attention to the great toe.  Mild soft tissue edema.  Electronically Signed   By: Chadwick Colonel M.D.   On: 03/13/2023 22:34 Note: Reviewed        Physical Exam  General appearance: Well nourished, well developed,  and well hydrated. In no apparent acute distress Mental status: Alert, oriented x 3 (person, place, & time)       Respiratory: No evidence of acute respiratory distress Eyes: PERLA Vitals: BP 125/62   Pulse 93   Temp (!) 97.3 F (36.3 C)   Resp 16   Ht 5' 2.5" (1.588 m)   Wt 195 lb (88.5 kg)   SpO2 99%   BMI 35.10 kg/m  BMI: Estimated body mass index is 35.1 kg/m as calculated from the following:   Height as of this encounter: 5' 2.5" (1.588 m).   Weight as of this encounter: 195 lb (88.5 kg). Ideal: Ideal body weight: 51.3 kg (112 lb 15.8 oz) Adjusted ideal body weight: 66.1 kg (145 lb 12.7 oz)  Assessment   Diagnosis Status  1. Chronic pain syndrome   2. Hx of fusion of cervical spine   3. Neck pain   4. Myofascial pain syndrome   5. H/O cervical spine surgery ([facility] C5-C7 in [place]  2016)    Controlled Controlled Controlled    Plan of Care  Renew UDS Continue robaxin  Prn, no refills needed Continue with stretching and home exercise  1Rx of Oxycodone  below lasts approx 3 months, patient takes 2.5 mg BID prn for severe pain   Pharmacotherapy (Medications Ordered): Meds ordered this encounter  Medications   oxyCODONE -acetaminophen  (PERCOCET) 5-325 MG tablet    Sig: Take 1 tablet by mouth every 12 (twelve) hours as needed for severe pain (pain score 7-10). Must last 30 days.    Dispense:  60 tablet    Refill:  0    Chronic Pain: STOP Act (Not applicable) Fill 1 day early if closed on refill date. Avoid benzodiazepines within 8 hours of opioids   Orders:  Orders Placed This Encounter  Procedures   ToxASSURE Select 13 (MW), Urine    Volume: 30 ml(s). Minimum 3 ml of urine is needed. Document temperature of fresh sample. Indications: Long term (current) use of opiate analgesic (Z30.865)    Release to patient:   Immediate   Follow-up plan:   Return in about 3 months (around 01/06/2024) for MM, F2F.     Recent Visits Date Type Provider Dept  07/16/23 Office  Visit Cephus Collin, MD [facility]  Showing recent visits within past 90 days and meeting all other requirements Today's Visits Date Type Provider Dept  10/06/23 Office Visit Cephus Collin, MD [facility]  Showing today's visits and meeting all other requirements Future Appointments No visits were found meeting these conditions. Showing future appointments within next 90 days and meeting all other requirements  I discussed the assessment and treatment plan with the patient. The patient was provided an opportunity to ask questions and all were answered. The patient agreed with  the plan and demonstrated an understanding of the instructions.  Patient advised to call back or seek an in-person evaluation if the symptoms or condition worsens.  Duration of encounter: 30 minutes.  Total time on encounter, as per AMA guidelines included both the face-to-face and non-face-to-face time personally spent by the physician and/or other qualified health care professional(s) on the day of the encounter (includes time in activities that require the physician or other qualified health care professional and does not include time in activities normally performed by clinical staff). Physician's time may include the following activities when performed: Preparing to see the patient (e.g., pre-charting review of records, searching for previously ordered imaging, lab work, and nerve conduction tests) Review of prior analgesic pharmacotherapies. Reviewing PMP Interpreting ordered tests (e.g., lab work, imaging, nerve conduction tests) Performing post-procedure evaluations, including interpretation of diagnostic procedures Obtaining and/or reviewing separately obtained history Performing a medically appropriate examination and/or evaluation Counseling and educating the patient/family/caregiver Ordering medications, tests, or procedures Referring and communicating with other health care  professionals (when not separately reported) Documenting clinical information in the electronic or other health record Independently interpreting results (not separately reported) and communicating results to the patient/ family/caregiver Care coordination (not separately reported)  Note by: Cephus Collin, MD Date: 10/06/2023; Time: 8:19 AM

## 2023-10-06 NOTE — Progress Notes (Signed)
 Nursing Pain Medication Assessment:  Safety precautions to be maintained throughout the outpatient stay will include: orient to surroundings, keep bed in low position, maintain call bell within reach at all times, provide assistance with transfer out of bed and ambulation.  Medication Inspection Compliance: Pill count conducted under aseptic conditions, in front of the patient. Neither the pills nor the bottle was removed from the patient's sight at any time. Once count was completed pills were immediately returned to the patient in their original bottle.  Medication: Oxycodone /APAP Pill/Patch Count: 15/60 pills Pill/Patch Appearance: Markings consistent with prescribed medication Bottle Appearance: Standard pharmacy container. Clearly labeled. Filled Date: 3 / 69 / 2025 Last Medication intake:  TodaySafety precautions to be maintained throughout the outpatient stay will include: orient to surroundings, keep bed in low position, maintain call bell within reach at all times, provide assistance with transfer out of bed and ambulation.

## 2023-10-06 NOTE — Patient Instructions (Signed)

## 2023-10-09 LAB — TOXASSURE SELECT 13 (MW), URINE

## 2024-01-05 ENCOUNTER — Encounter: Payer: Self-pay | Admitting: Nurse Practitioner

## 2024-01-05 ENCOUNTER — Ambulatory Visit: Attending: Student in an Organized Health Care Education/Training Program | Admitting: Nurse Practitioner

## 2024-01-05 VITALS — BP 112/70 | HR 94 | Temp 97.7°F | Ht 62.0 in | Wt 189.0 lb

## 2024-01-05 DIAGNOSIS — G894 Chronic pain syndrome: Secondary | ICD-10-CM | POA: Diagnosis not present

## 2024-01-05 DIAGNOSIS — Z79899 Other long term (current) drug therapy: Secondary | ICD-10-CM | POA: Diagnosis present

## 2024-01-05 DIAGNOSIS — Z9889 Other specified postprocedural states: Secondary | ICD-10-CM | POA: Insufficient documentation

## 2024-01-05 DIAGNOSIS — Z981 Arthrodesis status: Secondary | ICD-10-CM | POA: Insufficient documentation

## 2024-01-05 DIAGNOSIS — M7918 Myalgia, other site: Secondary | ICD-10-CM | POA: Insufficient documentation

## 2024-01-05 DIAGNOSIS — M542 Cervicalgia: Secondary | ICD-10-CM | POA: Diagnosis present

## 2024-01-05 MED ORDER — METHOCARBAMOL 750 MG PO TABS: 750.0000 mg | ORAL_TABLET | Freq: Three times a day (TID) | ORAL | 5 refills | Status: AC | PRN

## 2024-01-05 MED ORDER — IBUPROFEN 800 MG PO TABS: 800.0000 mg | ORAL_TABLET | Freq: Three times a day (TID) | ORAL | 2 refills | Status: DC | PRN

## 2024-01-05 MED ORDER — OXYCODONE-ACETAMINOPHEN 5-325 MG PO TABS: 1.0000 | ORAL_TABLET | Freq: Two times a day (BID) | ORAL | 0 refills | Status: AC | PRN

## 2024-01-05 NOTE — Progress Notes (Signed)
 Nursing Pain Medication Assessment:  Safety precautions to be maintained throughout the outpatient stay will include: orient to surroundings, keep bed in low position, maintain call bell within reach at all times, provide assistance with transfer out of bed and ambulation.  Medication Inspection Compliance: Pill count conducted under aseptic conditions, in front of the patient. Neither the pills nor the bottle was removed from the patient's sight at any time. Once count was completed pills were immediately returned to the patient in their original bottle.  Medication: Oxycodone /APAP Pill/Patch Count: 12.5 of 60 pills/patches remain Pill/Patch Appearance: Markings consistent with prescribed medication Bottle Appearance: Standard pharmacy container. Clearly labeled. Filled Date: 6 / 72 / 2025 Last Medication intake:  Yesterday

## 2024-01-05 NOTE — Progress Notes (Signed)
 PROVIDER NOTE: Interpretation of information contained herein should be left to medically-trained personnel. Specific patient instructions are provided elsewhere under Patient Instructions section of medical record. This document was created in part using AI and STT-dictation technology, any transcriptional errors that may result from this process are unintentional.  Patient: Shannon Cortez  Service: E/M   PCP: Alla Amis, MD  DOB: 01-12-1966  DOS: 01/05/2024  Provider: Emmy MARLA Blanch, NP  MRN: [id number]  Delivery: Face-to-face  Specialty: Interventional Pain Management  Type: Established Patient  Setting: Ambulatory outpatient facility  Specialty designation: 09  Referring Prov.: Alla Amis, MD  Location: Outpatient office facility       History of present illness (HPI) Shannon Cortez, a 58 y.o. year old female, is here today because of her Chronic pain syndrome [G89.4]. Shannon Cortez primary complain today is Shoulder Pain (Left shoulder and left neck)  Pertinent problems: Ms. Grand has Cellulitis; Hx of fusion of cervical spine; Neck pain; Myofascial pain syndrome; and Chronic pain syndrome on their pertinent problem list.   Pain Assessment: Severity of Chronic pain is reported as a 4 /10. Location: Shoulder Left/radiates down left side of neck. Onset: More than a month ago. Quality: Throbbing, Tingling, Burning. Timing: Constant. Modifying factor(s): ice, heat laying down. Vitals:  height is 5' 2 (1.575 m) and weight is 189 lb (85.7 kg). Her temperature is 97.7 F (36.5 C). Her blood pressure is 112/70 and her pulse is 94. Her oxygen saturation is 98%.  BMI: Estimated body mass index is 34.57 kg/m as calculated from the following:   Height as of this encounter: 5' 2 (1.575 m).   Weight as of this encounter: 189 lb (85.7 kg).  Last encounter: 10/06/2023 Last procedure: Visit date not found.  Reason for encounter: medication management. No change in medical  history since last visit.  Patient's pain is at baseline.  Patient continues multimodal pain regimen as prescribed.  States that it provides pain relief and improvement in functional status.   Pharmacotherapy Assessment   Percocet 5-225 mg daily as needed for pain, quantity of 60 last 3 months. MME=15 Methocarbamol  (Robaxin ) 750 mg tablet every 8 hours as needed for muscle spasm Ibuprofen  (Advil ) 800 mg every 8 hours as needed for moderate pain Monitoring: [place] PMP: PDMP reviewed during this encounter.       Pharmacotherapy: No side-effects or adverse reactions reported. Compliance: No problems identified. Effectiveness: Clinically acceptable.  Margrette Nathanel PARAS, RN  01/05/2024  8:07 AM  Sign when Signing Visit Nursing Pain Medication Assessment:  Safety precautions to be maintained throughout the outpatient stay will include: orient to surroundings, keep bed in low position, maintain call bell within reach at all times, provide assistance with transfer out of bed and ambulation.  Medication Inspection Compliance: Pill count conducted under aseptic conditions, in front of the patient. Neither the pills nor the bottle was removed from the patient's sight at any time. Once count was completed pills were immediately returned to the patient in their original bottle.  Medication: Oxycodone /APAP Pill/Patch Count: 12.5 of 60 pills/patches remain Pill/Patch Appearance: Markings consistent with prescribed medication Bottle Appearance: Standard pharmacy container. Clearly labeled. Filled Date: 6 / 73 / 2025 Last Medication intake:  Yesterday    UDS:  Summary  Date Value Ref Range Status  10/06/2023 FINAL  Final    Comment:    ==================================================================== [facility] (MW) ==================================================================== Test  Result       Flag       Units  Drug Present and Declared for Prescription  Verification   Oxycodone                       74           EXPECTED   ng/mg creat   Noroxycodone                   162          EXPECTED   ng/mg creat    Sources of oxycodone  include scheduled prescription medications.    Noroxycodone is an expected metabolite of oxycodone .  ==================================================================== Test                      Result    Flag   Units      Ref Range   Creatinine              136              mg/dL      >=79 ==================================================================== Declared Medications:  The flagging and interpretation on this report are based on the  following declared medications.  Unexpected results may arise from  inaccuracies in the declared medications.   **Note: The testing scope of this panel includes these medications:   Oxycodone    **Note: The testing scope of this panel does not include the  following reported medications:   Acetaminophen   Albuterol (Ventolin HFA)  Amlodipine (Norvasc)  Atorvastatin  (Lipitor)  Cetirizine (Zyrtec)  Dextromethorphan  Diphenhydramine (Benadryl)  Doxylamine  Escitalopram (Lexapro)  Fluticasone (Flonase)  Ibuprofen  (Advil )  Losartan (Cozaar)  Methocarbamol  (Robaxin )  Topical  Trazodone (Desyrel) ==================================================================== For clinical consultation, please call [phone]. ====================================================================     No results found for: CBDTHCR No results found for: D8THCCBX No results found for: D9THCCBX  ROS  Constitutional: Denies any fever or chills Gastrointestinal: No reported hemesis, hematochezia, vomiting, or acute GI distress Musculoskeletal: Left shoulder pain, neck pain (left) Neurological: No reported episodes of acute onset apraxia, aphasia, dysarthria, agnosia, amnesia, paralysis, loss of coordination, or loss of consciousness  Medication Review   DM-Doxylamine-Acetaminophen , NON FORMULARY, albuterol, amLODipine, atorvastatin , cetirizine, diphenhydrAMINE, escitalopram, fluticasone, ibuprofen , losartan, methocarbamol , oxyCODONE -acetaminophen , and traZODone  History Review  Allergy: Shannon Cortez is allergic to hyronan [sodium hyaluronate & lidocaine ], toradol [ketorolac tromethamine], and sulfa antibiotics. Drug: Ms. Beckum  reports no history of drug use. Alcohol:  reports no history of alcohol use. Tobacco:  reports that she quit smoking about 9 years ago. Her smoking use included cigarettes. She has never used smokeless tobacco. Social: Ms. Nault  reports that she quit smoking about 9 years ago. Her smoking use included cigarettes. She has never used smokeless tobacco. She reports that she does not drink alcohol and does not use drugs. Medical:  has a past medical history of DDD (degenerative disc disease), lumbosacral, Hyperlipidemia, and Hypertension. Surgical: Ms. Northrup  has a past surgical history that includes Neck surgery; Abdominal hysterectomy; and Nasal reconstruction with septal repair. Family: family history includes Hepatitis in her mother.  Laboratory Chemistry Profile   Renal Lab Results  Component Value Date   BUN 13 04/18/2018   CREATININE 0.88 04/18/2018   GFRAA >60 04/18/2018   GFRNONAA >60 04/18/2018    Hepatic Lab Results  Component Value Date   AST 22 04/18/2018   ALT 21 04/18/2018   ALBUMIN 3.6 04/18/2018  ALKPHOS 94 04/18/2018   LIPASE 33 04/18/2018    Electrolytes Lab Results  Component Value Date   NA 139 04/18/2018   K 3.6 04/18/2018   CL 106 04/18/2018   CALCIUM  8.7 (L) 04/18/2018    Bone No results found for: VD25OH, [id number], CI6874NY7, CI7874NY7, 25OHVITD1, 25OHVITD2, 25OHVITD3, TESTOFREE, TESTOSTERONE  Inflammation (CRP: Acute Phase) (ESR: Chronic Phase) Lab Results  Component Value Date   LATICACIDVEN 1.13 04/16/2018         Note: Above Lab  results reviewed.  Recent Imaging Review  DG Foot Complete Right CLINICAL DATA:  Injury to right great toe.  Pain.  EXAM: RIGHT FOOT COMPLETE - 3+ VIEW  COMPARISON:  None Available.  FINDINGS: There is no evidence of fracture or dislocation. There is no evidence of arthropathy or other focal bone abnormality. Mild soft tissue edema.  IMPRESSION: No fracture or dislocation, with particular attention to the great toe.  Mild soft tissue edema.  Electronically Signed   By: Andrea Gasman M.D.   On: 03/13/2023 22:34 Note: Reviewed        Physical Exam  Vitals: BP 112/70   Pulse 94   Temp 97.7 F (36.5 C)   Ht 5' 2 (1.575 m)   Wt 189 lb (85.7 kg)   SpO2 98%   BMI 34.57 kg/m  BMI: Estimated body mass index is 34.57 kg/m as calculated from the following:   Height as of this encounter: 5' 2 (1.575 m).   Weight as of this encounter: 189 lb (85.7 kg). Ideal: Ideal body weight: 50.1 kg (110 lb 7.2 oz) Adjusted ideal body weight: 64.4 kg (141 lb 13.9 oz) General appearance: Well nourished, well developed, and well hydrated. In no apparent acute distress Mental status: Alert, oriented x 3 (person, place, & time)       Respiratory: No evidence of acute respiratory distress Eyes: PERLA   Assessment   Diagnosis Status  1. Chronic pain syndrome   2. Hx of fusion of cervical spine   3. Neck pain   4. Myofascial pain syndrome   5. H/O cervical spine surgery ([facility] C5-C7 in [place]  2016)   6. Medication management    Controlled Controlled Controlled   Updated Problems: Problem  Medication Management    Plan of Care  Problem-specific:  Assessment and Plan We will continue on current medication regimen.  Prescribing drug monitoring (PDMP) reviewed; findings consistent with the use of prescribed medication and no evidence of narcotic misuse or abuse.  Urine drug screening (UDS) up-to-date.  No other new issues or problems reported to this visit.  Schedule follow-up  in 90 days for medication management.  Ms. Yovana Scogin has a current medication list which includes the following long-term medication(s): albuterol, amlodipine, atorvastatin , cetirizine, diphenhydramine, escitalopram, fluticasone, losartan, and trazodone.  Pharmacotherapy (Medications Ordered): Meds ordered this encounter  Medications   oxyCODONE -acetaminophen  (PERCOCET) 5-325 MG tablet    Sig: Take 1 tablet by mouth every 12 (twelve) hours as needed for severe pain (pain score 7-10). Must last 30 days.    Dispense:  60 tablet    Refill:  0    Chronic Pain: STOP Act (Not applicable) Fill 1 day early if closed on refill date. Avoid benzodiazepines within 8 hours of opioids   methocarbamol  (ROBAXIN ) 750 MG tablet    Sig: Take 1 tablet (750 mg total) by mouth every 8 (eight) hours as needed for muscle spasms.    Dispense:  90 tablet    Refill:  5  Do not place this medication, or any other prescription from our practice, on Automatic Refill. Patient may have prescription filled one day early if pharmacy is closed on scheduled refill date.   ibuprofen  (ADVIL ) 800 MG tablet    Sig: Take 1 tablet (800 mg total) by mouth every 8 (eight) hours as needed for moderate pain (pain score 4-6).    Dispense:  60 tablet    Refill:  2   Orders:  No orders of the defined types were placed in this encounter.     Return in about 3 months (around 04/05/2024) for (F2F), (MM), Emmy Blanch NP.    Recent Visits No visits were found meeting these conditions. Showing recent visits within past 90 days and meeting all other requirements Today's Visits Date Type Provider Dept  01/05/24 Office Visit Tome Wilson K, NP [facility]  Showing today's visits and meeting all other requirements Future Appointments Date Type Provider Dept  04/04/24 Appointment Jacky Hartung K, NP [facility]  Showing future appointments within next 90 days and meeting all other requirements  I discussed  the assessment and treatment plan with the patient. The patient was provided an opportunity to ask questions and all were answered. The patient agreed with the plan and demonstrated an understanding of the instructions.  Patient advised to call back or seek an in-person evaluation if the symptoms or condition worsens.  Duration of encounter: 30 minutes.  Total time on encounter, as per AMA guidelines included both the face-to-face and non-face-to-face time personally spent by the physician and/or other qualified health care professional(s) on the day of the encounter (includes time in activities that require the physician or other qualified health care professional and does not include time in activities normally performed by clinical staff). Physician's time may include the following activities when performed: Preparing to see the patient (e.g., pre-charting review of records, searching for previously ordered imaging, lab work, and nerve conduction tests) Review of prior analgesic pharmacotherapies. Reviewing PMP Interpreting ordered tests (e.g., lab work, imaging, nerve conduction tests) Performing post-procedure evaluations, including interpretation of diagnostic procedures Obtaining and/or reviewing separately obtained history Performing a medically appropriate examination and/or evaluation Counseling and educating the patient/family/caregiver Ordering medications, tests, or procedures Referring and communicating with other health care professionals (when not separately reported) Documenting clinical information in the electronic or other health record Independently interpreting results (not separately reported) and communicating results to the patient/ family/caregiver Care coordination (not separately reported)  Note by: Arlin Sass K Betsy Rosello, NP (TTS and AI technology used. I apologize for any typographical errors that were not detected and corrected.) Date: 01/05/2024; Time: 8:30 AM

## 2024-02-03 ENCOUNTER — Other Ambulatory Visit: Payer: Self-pay | Admitting: Family Medicine

## 2024-02-03 DIAGNOSIS — R1310 Dysphagia, unspecified: Secondary | ICD-10-CM

## 2024-02-03 DIAGNOSIS — I10 Essential (primary) hypertension: Secondary | ICD-10-CM

## 2024-02-15 ENCOUNTER — Ambulatory Visit
Admission: RE | Admit: 2024-02-15 | Discharge: 2024-02-15 | Disposition: A | Discharge status: Home / Self Care | Source: Ambulatory Visit | Attending: Family Medicine | Admitting: Family Medicine

## 2024-02-15 DIAGNOSIS — R1310 Dysphagia, unspecified: Secondary | ICD-10-CM | POA: Insufficient documentation

## 2024-02-15 DIAGNOSIS — I10 Essential (primary) hypertension: Secondary | ICD-10-CM | POA: Insufficient documentation

## 2024-02-15 NOTE — Therapy (Signed)
 Modified Barium Swallow Study  Patient Details  Name: Shannon Cortez MRN: [id number] Date of Birth: May 15, 1965  Today's Date: 02/15/2024  Modified Barium Swallow completed.  Full report located under Chart Review in the Imaging Section.  History of Present Illness Pt is a 58 y.o. female who presents for swallowing evaluation. Per PCP note, reports choking on solids. No problems with liquids. Happening more frequently that causes her to become nauseous and vomit. Pt with PMHx GAD, obesity, HTN, HLD.   Clinical Impression Pt demonstrated intact oropharyngeal swallow function. Concern for primary pharyngoesophageal dysphagia given pt's complaints as well as observed barium retention with retrograde bolus flow with solid. Liquid wash aided in clearance of material from esophagus. Given pt's complaints and results of MBSS, recommend continuation of a regular diet (with pt selecting naturally softer and well-moistened solids) with thin liquids with standard aspiration precautions and reflux precautions. Pt may benefit from GI consult/further GI work up. Factors that may increase risk of adverse event in presence of aspiration Noe & Lianne 2021):  respiratory or GI disease (suspected)  Swallow Evaluation Recommendations Recommendations: PO diet PO Diet Recommendation: Regular;Thin liquids (Level 0) Liquid Administration via: Spoon;Cup;Straw Medication Administration:  (as tolerated) Swallowing strategies  : Follow solids with liquids Postural changes: Position pt fully upright for meals;Out of bed for meals (upright 60-90 minutes after meals) Oral care recommendations: Oral care BID (2x/day) Recommended consults: Consider GI consultation;Consider esophageal assessment     Delon Bangs, M.S., CCC-SLP Speech-Language Pathologist [place] [facility] [phone] (ASCOM)  Delon HERO Izzabell Klasen 02/15/2024,1:12 PM

## 2024-04-04 ENCOUNTER — Ambulatory Visit: Attending: Nurse Practitioner | Admitting: Nurse Practitioner

## 2024-04-04 ENCOUNTER — Encounter: Payer: Self-pay | Admitting: Nurse Practitioner

## 2024-04-04 VITALS — BP 140/73 | HR 94 | Temp 97.1°F | Resp 18 | Ht 62.0 in | Wt 192.0 lb

## 2024-04-04 DIAGNOSIS — G894 Chronic pain syndrome: Secondary | ICD-10-CM | POA: Insufficient documentation

## 2024-04-04 DIAGNOSIS — M542 Cervicalgia: Secondary | ICD-10-CM | POA: Insufficient documentation

## 2024-04-04 DIAGNOSIS — Z981 Arthrodesis status: Secondary | ICD-10-CM | POA: Diagnosis not present

## 2024-04-04 DIAGNOSIS — Z9889 Other specified postprocedural states: Secondary | ICD-10-CM | POA: Diagnosis present

## 2024-04-04 DIAGNOSIS — M7918 Myalgia, other site: Secondary | ICD-10-CM | POA: Insufficient documentation

## 2024-04-04 DIAGNOSIS — Z79899 Other long term (current) drug therapy: Secondary | ICD-10-CM | POA: Diagnosis present

## 2024-04-04 MED ORDER — OXYCODONE-ACETAMINOPHEN 5-325 MG PO TABS: 1.0000 | ORAL_TABLET | Freq: Two times a day (BID) | ORAL | 0 refills | Status: AC | PRN

## 2024-04-04 MED ORDER — IBUPROFEN 800 MG PO TABS: 800.0000 mg | ORAL_TABLET | Freq: Three times a day (TID) | ORAL | 2 refills | Status: AC | PRN

## 2024-04-04 NOTE — Patient Instructions (Signed)
 Pain Management Discharge Instructions  General Discharge Instructions :  If you need to reach your doctor call: Monday-Friday 8:00 am - 4:00 pm at [phone] or toll free [phone].  After clinic hours [phone] to have operator reach doctor.  Bring all of your medication bottles to all your appointments in the pain clinic.  To cancel or reschedule your appointment with Pain Management please remember to call 24 hours in advance to avoid a fee.  Refer to the educational materials which you have been given on: General Risks, I had my Procedure. Discharge Instructions, Post Sedation.  Post Procedure Instructions:  The drugs you were given will stay in your system until tomorrow, so for the next 24 hours you should not drive, make any legal decisions or drink any alcoholic beverages.  You may eat anything you prefer, but it is better to start with liquids then soups and crackers, and gradually work up to solid foods.  Please notify your doctor immediately if you have any unusual bleeding, trouble breathing or pain that is not related to your normal pain.  Depending on the type of procedure that was done, some parts of your body may feel week and/or numb.  This usually clears up by tonight or the next day.  Walk with the use of an assistive device or accompanied by an adult for the 24 hours.  You may use ice on the affected area for the first 24 hours.  Put ice in a Ziploc bag and cover with a towel and place against area 15 minutes on 15 minutes off.  You may switch to heat after 24 hours.Opioid Pain Medicine Management Opioids are powerful medicines that are used to treat moderate to severe pain. When used for short periods of time, they can help you to: Sleep better. Do better in physical or occupational therapy. Feel better in the first few days after an injury. Recover from surgery. Opioids should be taken with the supervision of a trained health care provider. They should  be taken for the shortest period of time possible. This is because opioids can be addictive, and the longer you take opioids, the greater your risk of addiction. This addiction can also be called opioid use disorder. What are the risks? Using opioid pain medicines for longer than 3 days increases your risk of side effects. Side effects include: Constipation. Nausea and vomiting. Breathing difficulties (respiratory depression). Drowsiness. Confusion. Opioid use disorder. Itching. Taking opioid pain medicine for a long period of time can affect your ability to do daily tasks. It also puts you at risk for: Motor vehicle crashes. Depression. Suicide. Heart attack. Overdose, which can be life-threatening. What is a pain treatment plan? A pain treatment plan is an agreement between you and your health care provider. Pain is unique to each person, and treatments vary depending on your condition. To manage your pain, you and your health care provider need to work together. To help you do this: Discuss the goals of your treatment, including how much pain you might expect to have and how you will manage the pain. Review the risks and benefits of taking opioid medicines. Remember that a good treatment plan uses more than one approach and minimizes the chance of side effects. Be honest about the amount of medicines you take and about any drug or alcohol use. Get pain medicine prescriptions from only one health care provider. Pain can be managed with many types of alternative treatments. Ask your health care provider to refer you  to one or more specialists who can help you manage pain through: Physical or occupational therapy. Counseling (cognitive behavioral therapy). Good nutrition. Biofeedback. Massage. Meditation. Non-opioid medicine. Following a gentle exercise program. How to use opioid pain medicine Taking medicine Take your pain medicine exactly as told by your health care provider. Take  it only when you need it. If your pain gets less severe, you may take less than your prescribed dose if your health care provider approves. If you are not having pain, do nottake pain medicine unless your health care provider tells you to take it. If your pain is severe, do nottry to treat it yourself by taking more pills than instructed on your prescription. Contact your health care provider for help. Write down the times when you take your pain medicine. It is easy to become confused while on pain medicine. Writing the time can help you avoid overdose. Take other over-the-counter or prescription medicines only as told by your health care provider. Keeping yourself and others safe  While you are taking opioid pain medicine: Do not drive, use machinery, or power tools. Do not sign legal documents. Do not drink alcohol. Do not take sleeping pills. Do not supervise children by yourself. Do not do activities that require climbing or being in high places. Do not go to a lake, river, ocean, spa, or swimming pool. Do not share your pain medicine with anyone. Keep pain medicine in a locked cabinet or in a secure area where pets and children cannot reach it. Stopping your use of opioids If you have been taking opioid medicine for more than a few weeks, you may need to slowly decrease (taper) how much you take until you stop completely. Tapering your use of opioids can decrease your risk of symptoms of withdrawal, such as: Pain and cramping in the abdomen. Nausea. Sweating. Sleepiness. Restlessness. Uncontrollable shaking (tremors). Cravings for the medicine. Do not attempt to taper your use of opioids on your own. Talk with your health care provider about how to do this. Your health care provider may prescribe a step-down schedule based on how much medicine you are taking and how long you have been taking it. Getting rid of leftover pills Do not save any leftover pills. Get rid of leftover pills  safely by: Taking the medicine to a prescription take-back program. This is usually offered by the county or law enforcement. Bringing them to a pharmacy that has a drug disposal container. Flushing them down the toilet. Check the label or package insert of your medicine to see whether this is safe to do. Throwing them out in the trash. Check the label or package insert of your medicine to see whether this is safe to do. If it is safe to throw it out, remove the medicine from the original container, put it into a sealable bag or container, and mix it with used coffee grounds, food scraps, dirt, or cat litter before putting it in the trash. Follow these instructions at home: Activity Do exercises as told by your health care provider. Avoid activities that make your pain worse. Return to your normal activities as told by your health care provider. Ask your health care provider what activities are safe for you. General instructions You may need to take these actions to prevent or treat constipation: Drink enough fluid to keep your urine pale yellow. Take over-the-counter or prescription medicines. Eat foods that are high in fiber, such as beans, whole grains, and fresh fruits and vegetables. Limit  foods that are high in fat and processed sugars, such as fried or sweet foods. Keep all follow-up visits. This is important. Where to find support If you have been taking opioids for a long time, you may benefit from receiving support for quitting from a local support group or counselor. Ask your health care provider for a referral to these resources in your area. Where to find more information Centers for Disease Control and Prevention (CDC): footballexhibition.com.br [place] Food and Drug Administration (FDA): pumpkinsearch.com.ee Get help right away if: You may have taken too much of an opioid (overdosed). Common symptoms of an overdose: Your breathing is slower or more shallow than normal. You have a very slow heartbeat  (pulse). You have slurred speech. You have nausea and vomiting. Your pupils become very small. You have other potential symptoms: You are very confused. You faint or feel like you will faint. You have cold, clammy skin. You have blue lips or fingernails. You have thoughts of harming yourself or harming others. These symptoms may represent a serious problem that is an emergency. Do not wait to see if the symptoms will go away. Get medical help right away. Call your local emergency services (911 in the [place] Do not drive yourself to the hospital.  If you ever feel like you may hurt yourself or others, or have thoughts about taking your own life, get help right away. Go to your nearest emergency department or: Call your local emergency services (911 in the [place] Call the [facility] ((629) 121-9388 in the [place] Call a suicide crisis helpline, such as the National Suicide Prevention Lifeline at 541 728 2970 or 988 in the [place] This is open 24 hours a day in the [place] If you're a Veteran: Call 988 and press 1. This is open 24 hours a day. Text the [employer] at 519-411-7521. Summary Opioid medicines can help you manage moderate to severe pain for a short period of time. A pain treatment plan is an agreement between you and your health care provider. Discuss the goals of your treatment, including how much pain you might expect to have and how you will manage the pain. If you think that you or someone else may have taken too much of an opioid, get medical help right away. This information is not intended to replace advice given to you by your health care provider. Make sure you discuss any questions you have with your health care provider. Document Revised: 01/26/2023 Document Reviewed: 08/01/2020 Elsevier Patient Education  2024 [employer].

## 2024-04-04 NOTE — Progress Notes (Signed)
 Nursing Pain Medication Assessment:  Safety precautions to be maintained throughout the outpatient stay will include: orient to surroundings, keep bed in low position, maintain call bell within reach at all times, provide assistance with transfer out of bed and ambulation.  Medication Inspection Compliance: Pill count conducted under aseptic conditions, in front of the patient. Neither the pills nor the bottle was removed from the patient's sight at any time. Once count was completed pills were immediately returned to the patient in their original bottle.  Medication: Oxycodone /APAP Pill/Patch Count: 11 of 60 pills/patches remain Pill/Patch Appearance: Markings consistent with prescribed medication Bottle Appearance: Standard pharmacy container. Clearly labeled. Filled Date: 09 / 02 / 2025 Last Medication intake:  Yesterday

## 2024-04-04 NOTE — Progress Notes (Signed)
 PROVIDER NOTE: Interpretation of information contained herein should be left to medically-trained personnel. Specific patient instructions are provided elsewhere under Patient Instructions section of medical record. This document was created in part using AI and STT-dictation technology, any transcriptional errors that may result from this process are unintentional.  Patient: Shannon Cortez  Service: E/M   PCP: Shannon Amis, MD  DOB: 1965/11/04  DOS: 04/04/2024  Provider: Emmy MARLA Blanch, NP  MRN: [id number]  Delivery: Face-to-face  Specialty: Interventional Pain Management  Type: Established Patient  Setting: Ambulatory outpatient facility  Specialty designation: 09  Referring Prov.: Shannon Amis, MD  Location: Outpatient office facility       History of present illness (HPI) [name], a 58 y.o. year old female, is here today because of her Chronic pain syndrome [G89.4], neck pain on left side, and left shoulder pain. Shannon Cortez primary complain today is Neck Pain (Left Side) and Shoulder Pain (Left side)  Pertinent problems: Shannon Cortez has Cellulitis; Chronic pain syndrome; Hx of fusion of cervical spine; Neck pain; Myofascial pain syndrome; Medication management; Class 2 severe obesity due to excess calories with serious comorbidity and body mass index (BMI) of 36.0 to 36.9 in adult; and Lumbar radiculopathy, chronic on their pertinent problem list.  Pain Assessment: Severity of Chronic pain is reported as a 5 /10. Location: Neck Left/Into left shoulder. Onset: More than a month ago. Quality: Stabbing, Burning. Timing: Constant. Modifying factor(s): Heat. Vitals:  height is 5' 2 (1.575 m) and weight is 192 lb (87.1 kg). Her temporal temperature is 97.1 F (36.2 C) (abnormal). Her blood pressure is 140/73 (abnormal) and her pulse is 94. Her respiration is 18 and oxygen saturation is 100%.  BMI: Estimated body mass index is 35.12 kg/m as calculated from the  following:   Height as of this encounter: 5' 2 (1.575 m).   Weight as of this encounter: 192 lb (87.1 kg).  Last encounter: 01/05/2024. Last procedure: Visit date not found.  Reason for encounter: medication management. No change in medical history since last visit.  Patient's pain is at baseline.  Patient continues multimodal pain regimen as prescribed.  States that it provides pain relief and improvement in functional status.   Discussed the use of AI scribe software for clinical note transcription with the patient, who gave verbal consent to proceed.  History of Present Illness   Shannon Cortez is a 58 year old female with a history of neck surgery and herniated discs who presents with chronic left shoulder pain.  She experiences chronic pain localized to the left shoulder, exacerbated by certain movements, particularly when turning. The pain has persisted since her neck surgery, which involved complete replacement and fusion. Initially, the pain was more widespread, affecting her entire back, but has since localized to the shoulder and neck area.  She has a history of neck surgery and two herniated discs in her lower back. The neck surgery was performed to alleviate severe pain that impaired her ability to walk, stand, or straighten up. Post-surgery, the neck and shoulder pain have been more prominent than the lower back pain, which has not been surgically addressed.  Her current medication regimen includes oxycodone  5-325 mg, which she stretches over ninety days, ibuprofen  as needed, and metocarbamol, which she takes regularly. No side effects from these medications. She uses ibuprofen  sparingly, primarily at work, to avoid taking stronger medications, and utilizes heating pads to manage pain.  She is cautious about lifting heavy objects. She has not received  injections or other treatments for her shoulder pain since the surgery.     Pharmacotherapy Assessment   Percocet 5-225 mg daily  as needed for pain, quantity of 60 last 3 months. MME=15 Ibuprofen  (Advil ) 800 mg every 8 hours as needed for moderate pain Monitoring: [place] PMP: PDMP reviewed during this encounter.       Pharmacotherapy: No side-effects or adverse reactions reported. Compliance: No problems identified. Effectiveness: Clinically acceptable.  [name], [place]  04/04/2024  8:11 AM  Sign when Signing Visit Nursing Pain Medication Assessment:  Safety precautions to be maintained throughout the outpatient stay will include: orient to surroundings, keep bed in low position, maintain call bell within reach at all times, provide assistance with transfer out of bed and ambulation.  Medication Inspection Compliance: Pill count conducted under aseptic conditions, in front of the patient. Neither the pills nor the bottle was removed from the patient's sight at any time. Once count was completed pills were immediately returned to the patient in their original bottle.  Medication: Oxycodone /APAP Pill/Patch Count: 11 of 60 pills/patches remain Pill/Patch Appearance: Markings consistent with prescribed medication Bottle Appearance: Standard pharmacy container. Clearly labeled. Filled Date: 09 / 02 / 2025 Last Medication intake:  Yesterday    UDS:  Summary  Date Value Ref Range Status  10/06/2023 FINAL  Final    Comment:    ==================================================================== [employer] Select 13 (MW) ==================================================================== Test                             Result       Flag       Units  Drug Present and Declared for Prescription Verification   Oxycodone                       74           EXPECTED   ng/mg creat   Noroxycodone                   162          EXPECTED   ng/mg creat    Sources of oxycodone  include scheduled prescription medications.    Noroxycodone is an expected metabolite of  oxycodone .  ==================================================================== Test                      Result    Flag   Units      Ref Range   Creatinine              136              mg/dL      >=79 ==================================================================== Declared Medications:  The flagging and interpretation on this report are based on the  following declared medications.  Unexpected results may arise from  inaccuracies in the declared medications.   **Note: The testing scope of this panel includes these medications:   Oxycodone    **Note: The testing scope of this panel does not include the  following reported medications:   Acetaminophen   Albuterol (Ventolin HFA)  Amlodipine (Norvasc)  Atorvastatin  (Lipitor)  Cetirizine (Zyrtec)  Dextromethorphan  Diphenhydramine (Benadryl)  Doxylamine  Escitalopram (Lexapro)  Fluticasone (Flonase)  Ibuprofen  (Advil )  Losartan (Cozaar)  Methocarbamol  (Robaxin )  Topical  Trazodone (Desyrel) ==================================================================== For clinical consultation, please call [phone]. ====================================================================     No results found for: CBDTHCR No results found for: D8THCCBX No results found for:  D9THCCBX  ROS  Constitutional: Denies any fever or chills Gastrointestinal: No reported hemesis, hematochezia, vomiting, or acute GI distress Musculoskeletal: left shoulder pain, neck pain (left side) Neurological: No reported episodes of acute onset apraxia, aphasia, dysarthria, agnosia, amnesia, paralysis, loss of coordination, or loss of consciousness  Medication Review  DM-Doxylamine-Acetaminophen , NON FORMULARY, albuterol, amLODipine, atorvastatin , busPIRone, cetirizine, diphenhydrAMINE, escitalopram, fluticasone, ibuprofen , losartan, methocarbamol , oxyCODONE -acetaminophen , and traZODone  History Review  Allergy: Shannon Cortez is allergic to  hyronan [sodium hyaluronate & lidocaine ], toradol [ketorolac tromethamine], and sulfa antibiotics. Drug: Shannon Cortez  reports no history of drug use. Alcohol:  reports no history of alcohol use. Tobacco:  reports that she quit smoking about 9 years ago. Her smoking use included cigarettes. She has never used smokeless tobacco. Social: Shannon Cortez  reports that she quit smoking about 9 years ago. Her smoking use included cigarettes. She has never used smokeless tobacco. She reports that she does not drink alcohol and does not use drugs. Medical:  has a past medical history of DDD (degenerative disc disease), lumbosacral, Hyperlipidemia, and Hypertension. Surgical: Shannon Cortez  has a past surgical history that includes Neck surgery; Abdominal hysterectomy; and Nasal reconstruction with septal repair. Family: family history includes Hepatitis in her mother.  Laboratory Chemistry Profile   Renal Lab Results  Component Value Date   BUN 13 04/18/2018   CREATININE 0.88 04/18/2018   GFRAA >60 04/18/2018   GFRNONAA >60 04/18/2018    Hepatic Lab Results  Component Value Date   AST 22 04/18/2018   ALT 21 04/18/2018   ALBUMIN 3.6 04/18/2018   ALKPHOS 94 04/18/2018   LIPASE 33 04/18/2018    Electrolytes Lab Results  Component Value Date   NA 139 04/18/2018   K 3.6 04/18/2018   CL 106 04/18/2018   CALCIUM  8.7 (L) 04/18/2018    Bone No results found for: VD25OH, CI874NY7UNU, CI6874NY7, CI7874NY7, 25OHVITD1, 25OHVITD2, 25OHVITD3, TESTOFREE, TESTOSTERONE  Inflammation (CRP: Acute Phase) (ESR: Chronic Phase) Lab Results  Component Value Date   LATICACIDVEN 1.13 04/16/2018         Note: Above Lab results reviewed.  Recent Imaging Review  DG SWALLOW FUNC SPEECH PATH CLINICAL DATA:  Dysphagia.  : MODIFIED BARIUM SWALLOW  TECHNIQUE: Different consistencies of barium were administered orally to the patient by the Speech Pathologist. Imaging of the pharynx  was performed in the lateral projection.  Radiologist, not in attendance for the exam.  FLUOROSCOPY TIME:  Radiation Exposure Index (as provided by the fluoroscopic device): 21.6 mGy Kerma  COMPARISON:  None Available.  FINDINGS: Modified barium swallow was performed by the speech pathologist. Radiologist was not involved with this exam. Please refer to the Speech Pathology report for results and recommendations.  IMPRESSION: Please refer to the Speech Pathologists report for complete details and recommendations.  Electronically Signed   By: Marcey Moan M.D.   On: 02/16/2024 17:09 Note: Reviewed        Physical Exam  Vitals: BP (!) 140/73 (BP Location: Right Arm, Patient Position: Sitting, Cuff Size: Normal)   Pulse 94   Temp (!) 97.1 F (36.2 C) (Temporal)   Resp 18   Ht 5' 2 (1.575 m)   Wt 192 lb (87.1 kg)   SpO2 100%   BMI 35.12 kg/m  BMI: Estimated body mass index is 35.12 kg/m as calculated from the following:   Height as of this encounter: 5' 2 (1.575 m).   Weight as of this encounter: 192 lb (87.1 kg). Ideal: Ideal body weight:  50.1 kg (110 lb 7.2 oz) Adjusted ideal body weight: 64.9 kg (143 lb 1.1 oz) General appearance: Well nourished, well developed, and well hydrated. In no apparent acute distress Mental status: Alert, oriented x 3 (person, place, & time)       Respiratory: No evidence of acute respiratory distress Eyes: PERLA  Musculoskeletal: left shoulder pain Neck pain on left side Cervical Spine Exam  Skin & Axial Inspection: No masses, redness, edema, swelling, or associated skin lesions Alignment: Symmetrical Functional ROM: Pain restricted ROM, to the left Stability: No instability detected Muscle Tone/Strength: Functionally intact. No obvious neuro-muscular anomalies detected. Sensory (Neurological): Musculoskeletal pain pattern Palpation: No palpable anomalies             Assessment   Diagnosis Status  1. Chronic pain syndrome    2. Hx of fusion of cervical spine   3. Neck pain   4. Myofascial pain syndrome   5. H/O cervical spine surgery ([facility] C5-C7 in [place]  2016)   6. Medication management    Controlled Controlled Controlled   Updated Problems: No problems updated.  Plan of Care  Problem-specific:  Assessment and Plan    Chronic pain syndrome with cervicalgia and myalgia Chronic pain syndrome with left-sided cervicalgia and myalgia, managed with oxycodone  and ibuprofen . No medication side effects reported. - Continue oxycodone  5-325 mg as prescribed. - Refilled ibuprofen  as needed. - Continue metocarbamol as prescribed. - Advised use of heating pads. Patient's pain is controlled with oxycodone , will continue on current medication regimen. Prescribing drug monitoring (PDMP) reviewed; findings consistent with the use of prescribed medication and no evidence of narcotic misuse or abuse.  Urine drug screening (UDS) up-to-date. No side effects or adverse reaction reported to medication. Schedule follow-up in 90 days for medication management.   Status post cervical spine arthrodesis Post-arthrodesis pain management ongoing. No further surgery planned for lower back.       Shannon Cortez has a current medication list which includes the following long-term medication(s): albuterol, amlodipine, atorvastatin , cetirizine, diphenhydramine, escitalopram, fluticasone, losartan, and trazodone.  Pharmacotherapy (Medications Ordered): Meds ordered this encounter  Medications   oxyCODONE -acetaminophen  (PERCOCET) 5-325 MG tablet    Sig: Take 1 tablet by mouth every 12 (twelve) hours as needed for severe pain (pain score 7-10). Must last 30 days.    Dispense:  60 tablet    Refill:  0   ibuprofen  (ADVIL ) 800 MG tablet    Sig: Take 1 tablet (800 mg total) by mouth every 8 (eight) hours as needed for moderate pain (pain score 4-6).    Dispense:  60 tablet    Refill:  2   Orders:  No orders of the defined  types were placed in this encounter.     Return in about 3 months (around 07/03/2024) for (F2F), (MM), Shannon Blanch NP.    Recent Visits Date Type Provider Dept  01/05/24 Office Visit Estiven Kohan K, NP [facility]  Showing recent visits within past 90 days and meeting all other requirements Today's Visits Date Type Provider Dept  04/04/24 Office Visit Everleigh Colclasure K, NP [facility]  Showing today's visits and meeting all other requirements Future Appointments No visits were found meeting these conditions. Showing future appointments within next 90 days and meeting all other requirements  I discussed the assessment and treatment plan with the patient. The patient was provided an opportunity to ask questions and all were answered. The patient agreed with the plan and demonstrated an understanding of the  instructions.  Patient advised to call back or seek an in-person evaluation if the symptoms or condition worsens.  I personally spent a total of 30 minutes in the care of the patient today including preparing to see the patient, getting/reviewing separately obtained history, performing a medically appropriate exam/evaluation, counseling and educating, placing orders, referring and communicating with other health care professionals, documenting clinical information in the EHR, independently interpreting results, communicating results, and coordinating care.   Note by: Yides Saidi K Gleb Mcguire, NP (TTS and AI technology used. I apologize for any typographical errors that were not detected and corrected.) Date: 04/04/2024; Time: 8:28 AM

## 2024-04-11 ENCOUNTER — Ambulatory Visit
Admission: RE | Admit: 2024-04-11 | Discharge: 2024-04-11 | Disposition: A | Discharge status: Home / Self Care | Attending: Gastroenterology | Admitting: Gastroenterology

## 2024-04-11 ENCOUNTER — Ambulatory Visit: Admitting: Certified Registered"

## 2024-04-11 ENCOUNTER — Encounter: Payer: Self-pay | Admitting: Gastroenterology

## 2024-04-11 ENCOUNTER — Encounter: Admission: RE | Disposition: A | Payer: Self-pay | Attending: Gastroenterology

## 2024-04-11 HISTORY — PX: ESOPHAGOGASTRODUODENOSCOPY: SHX5428

## 2024-04-11 SURGERY — EGD (ESOPHAGOGASTRODUODENOSCOPY)
Anesthesia: General

## 2024-04-11 MED ORDER — LIDOCAINE HCL (CARDIAC) PF 100 MG/5ML IV SOSY
PREFILLED_SYRINGE | INTRAVENOUS | Status: DC | PRN
  Administered 2024-04-11: 50 mg via INTRAVENOUS

## 2024-04-11 MED ORDER — PROPOFOL 1000 MG/100ML IV EMUL
INTRAVENOUS | Status: AC
  Filled 2024-04-11: qty 200

## 2024-04-11 MED ORDER — PROPOFOL 10 MG/ML IV BOLUS
INTRAVENOUS | Status: DC | PRN
  Administered 2024-04-11: 100 mg via INTRAVENOUS
  Administered 2024-04-11: 50 mg via INTRAVENOUS

## 2024-04-11 MED ORDER — SODIUM CHLORIDE 0.9 % IV SOLN
INTRAVENOUS | Status: DC
  Administered 2024-04-11: 500 mL via INTRAVENOUS

## 2024-04-11 NOTE — Op Note (Addendum)
 [facility] Gastroenterology Patient Name: Shannon Cortez Procedure Date: 04/11/2024 7:17 AM MRN: [id number] Account #: [id number] Date of Birth: [birth year] Admit Type: Outpatient Age: 58 Room: [facility] ENDO ROOM 1 Gender: Female Note Status: Supervisor Override Instrument Name: Barnie GI Scope [id number] Procedure:             Upper GI endoscopy Indications:           Dysphagia Providers:             Ruel Kung MD, MD Referring MD:          Ruel Kung MD, MD (Referring MD), Alda Carpen                         (Referring MD) Medicines:             Monitored Anesthesia Care Complications:         No immediate complications. Procedure:             Pre-Anesthesia Assessment:                        - Prior to the procedure, a History and Physical was                         performed, and patient medications, allergies and                         sensitivities were reviewed. The patient's tolerance                         of previous anesthesia was reviewed.                        - The risks and benefits of the procedure and the                         sedation options and risks were discussed with the                         patient. All questions were answered and informed                         consent was obtained.                        - ASA Grade Assessment: II - A patient with mild                         systemic disease.                        After obtaining informed consent, the endoscope was                         passed under direct vision. Throughout the procedure,                         the patient's blood pressure, pulse, and oxygen                         saturations were  monitored continuously. The Endoscope                         was introduced through the mouth, and advanced to the                         third part of duodenum. The upper GI endoscopy was                         accomplished with ease. The patient tolerated the                          procedure well. Findings:      A single 7 mm mucosal nodule with a localized distribution was found at       the gastroesophageal junction. The nodule was Paris classification Is       (protruding, sessile). Biopsies were taken with a cold forceps for       histology.      Normal mucosa was found in the entire esophagus. Biopsies were taken       with a cold forceps for histology.      The stomach was normal.      The examined duodenum was normal. Impression:            - Mucosal nodule found in the esophagus. Biopsied.                        - Normal mucosa was found in the entire esophagus.                         Biopsied.                        - Normal stomach.                        - Normal examined duodenum. Recommendation:        - Discharge patient to home (with escort).                        - Resume previous diet.                        - Continue present medications.                        - Await pathology results.                        - Return to my office as previously scheduled. Procedure Code(s):     --- Professional ---                        [phone], Esophagogastroduodenoscopy, flexible,                         transoral; with biopsy, single or multiple Diagnosis Code(s):     --- Professional ---                        K22.89, Other specified disease of esophagus  R13.10, Dysphagia, unspecified CPT copyright 2022 American Medical Association. All rights reserved. The codes documented in this report are preliminary and upon coder review may  be revised to meet current compliance requirements. Ruel Kung, MD Ruel Kung MD, MD 04/11/2024 8:32:06 AM This report has been signed electronically. Number of Addenda: 0 Note Initiated On: 04/11/2024 7:17 AM Estimated Blood Loss:  Estimated blood loss: none.      [facility]

## 2024-04-11 NOTE — Transfer of Care (Signed)
 Immediate Anesthesia Transfer of Care Note  Patient: Shannon Cortez  Procedure(s) Performed: EGD (ESOPHAGOGASTRODUODENOSCOPY)  Patient Location: Endoscopy Unit  Anesthesia Type:General  Level of Consciousness: drowsy  Airway & Oxygen Therapy: Patient Spontanous Breathing  Post-op Assessment: Report given to RN  Post vital signs: stable  Last Vitals:  Vitals Value Taken Time  BP    Temp    Pulse    Resp    SpO2      Last Pain:  Vitals:   04/11/24 0718  TempSrc: Temporal  PainSc: 0-No pain         Complications: No notable events documented.

## 2024-04-11 NOTE — Anesthesia Postprocedure Evaluation (Signed)
 Anesthesia Post Note  Patient: Shannon Cortez  Procedure(s) Performed: EGD (ESOPHAGOGASTRODUODENOSCOPY)  Patient location during evaluation: Endoscopy Anesthesia Type: General Level of consciousness: awake and alert Pain management: pain level controlled Vital Signs Assessment: post-procedure vital signs reviewed and stable Respiratory status: spontaneous breathing, nonlabored ventilation, respiratory function stable and patient connected to nasal cannula oxygen Cardiovascular status: blood pressure returned to baseline and stable Postop Assessment: no apparent nausea or vomiting Anesthetic complications: no   No notable events documented.   Last Vitals:  Vitals:   04/11/24 0841 04/11/24 0851  BP: 98/67 101/66  Pulse: 72 68  Resp: 17 12  Temp:    SpO2: 96% 97%    Last Pain:  Vitals:   04/11/24 0851  TempSrc:   PainSc: 0-No pain                 [name]

## 2024-04-11 NOTE — Anesthesia Preprocedure Evaluation (Addendum)
 Anesthesia Evaluation  Patient identified by MRN, date of birth, ID band Patient awake    Reviewed: Allergy & Precautions, NPO status , Patient's Chart, lab work & pertinent test results  History of Anesthesia Complications Negative for: history of anesthetic complications  Airway Mallampati: IV  TM Distance: >3 FB Neck ROM: full    Dental no notable dental hx.    Pulmonary former smoker   Pulmonary exam normal        Cardiovascular hypertension, On Medications Normal cardiovascular exam     Neuro/Psych  PSYCHIATRIC DISORDERS Anxiety      Neuromuscular disease    GI/Hepatic negative GI ROS, Neg liver ROS,,,  Endo/Other  negative endocrine ROS    Renal/GU negative Renal ROS  negative genitourinary   Musculoskeletal   Abdominal   Peds  Hematology negative hematology ROS (+)   Anesthesia Other Findings Past Medical History: No date: DDD (degenerative disc disease), lumbosacral No date: Hyperlipidemia No date: Hypertension  Past Surgical History: No date: ABDOMINAL HYSTERECTOMY No date: NASAL RECONSTRUCTION WITH SEPTAL REPAIR No date: NECK SURGERY  BMI    Body Mass Index: 35.10 kg/m      Reproductive/Obstetrics negative OB ROS                              Anesthesia Physical Anesthesia Plan  ASA: 3  Anesthesia Plan: General   Post-op Pain Management: Minimal or no pain anticipated   Induction: Intravenous  PONV Risk Score and Plan: 2 and Propofol  infusion and TIVA  Airway Management Planned: Natural Airway and Nasal Cannula  Additional Equipment:   Intra-op Plan:   Post-operative Plan:   Informed Consent: I have reviewed the patients History and Physical, chart, labs and discussed the procedure including the risks, benefits and alternatives for the proposed anesthesia with the patient or authorized representative who has indicated his/her understanding and  acceptance.     Dental Advisory Given  Plan Discussed with: Anesthesiologist, CRNA and Surgeon  Anesthesia Plan Comments: (Patient consented for risks of anesthesia including but not limited to:  - adverse reactions to medications - risk of airway placement if required - damage to eyes, teeth, lips or other oral mucosa - nerve damage due to positioning  - sore throat or hoarseness - Damage to heart, brain, nerves, lungs, other parts of body or loss of life  Patient voiced understanding and assent.)         Anesthesia Quick Evaluation

## 2024-04-11 NOTE — H&P (Signed)
 Ruel Kung , MD [address], [address], [place], [place], [address] Phone: [phone] Fax: [phone]  Primary Care Physician:  Alla Amis, MD   Pre-Procedure History & Physical: HPI:  Shannon Cortez is a 58 y.o. female is here for an endoscopy    Past Medical History:  Diagnosis Date   DDD (degenerative disc disease), lumbosacral    Hyperlipidemia    Hypertension     Past Surgical History:  Procedure Laterality Date   ABDOMINAL HYSTERECTOMY     NASAL RECONSTRUCTION WITH SEPTAL REPAIR     NECK SURGERY      Prior to Admission medications   Medication Sig Start Date End Date Taking? Authorizing Provider  amLODipine (NORVASC) 10 MG tablet Take 10 mg by mouth daily.   Yes [provider]  atorvastatin  (LIPITOR) 20 MG tablet Take 20 mg by mouth daily.   Yes [provider]  busPIRone (BUSPAR) 5 MG tablet Take 5 mg by mouth 2 (two) times daily.   Yes [provider]  cetirizine (ZYRTEC) 10 MG tablet Take 1 tablet by mouth daily. 01/27/23  Yes [provider]  diphenhydrAMINE (BENADRYL) 25 MG tablet Take 25 mg by mouth at bedtime as needed.   Yes [provider]  DM-Doxylamine-Acetaminophen  (NYQUIL COLD & FLU PO) Take by mouth.   Yes [provider]  escitalopram (LEXAPRO) 5 MG tablet Take 15 mg by mouth daily.   Yes [provider]  fluticasone (FLONASE) 50 MCG/ACT nasal spray Place 2 sprays into both nostrils daily. 01/27/23  Yes [provider]  ibuprofen  (ADVIL ) 800 MG tablet Take 1 tablet (800 mg total) by mouth every 8 (eight) hours as needed for moderate pain (pain score 4-6). 04/04/24  Yes Patel, Seema K, NP  losartan (COZAAR) 100 MG tablet Take 100 mg by mouth at bedtime. 01/15/23  Yes [provider]  methocarbamol  (ROBAXIN ) 750 MG tablet Take 1 tablet (750 mg total) by mouth every 8 (eight) hours as needed for muscle spasms. 01/05/24  Yes Patel, Seema K, NP   oxyCODONE -acetaminophen  (PERCOCET) 5-325 MG tablet Take 1 tablet by mouth every 12 (twelve) hours as needed for severe pain (pain score 7-10). Must last 30 days. 04/04/24 05/04/24 Yes Patel, Seema K, NP  oxyCODONE -acetaminophen  (PERCOCET/ROXICET) 5-325 MG tablet Take 1 tablet by mouth.   Yes [provider]  traZODone (DESYREL) 50 MG tablet Take 50 mg by mouth at bedtime as needed. 08/03/21  Yes [provider]  albuterol (VENTOLIN HFA) 108 (90 Base) MCG/ACT inhaler Inhale 2 puffs into the lungs every 6 (six) hours as needed for shortness of breath. 04/09/21 04/04/24  [provider]  NON FORMULARY Apply 8 mg topically 2 (two) times daily. Contains CBD    [provider]    Allergies as of 03/10/2024 - Review Complete 01/05/2024  Allergen Reaction Noted   Hyronan [sodium hyaluronate & lidocaine ]  12/03/2020   Toradol [ketorolac tromethamine] Itching 04/12/2018   Sulfa antibiotics Rash 04/12/2018    Family History  Problem Relation Age of Onset   Hepatitis Mother     Social History   Socioeconomic History   Marital status: Married    Spouse name: Not on file   Number of children: Not on file   Years of education: Not on file   Highest education level: Not on file  Occupational History   Not on file  Tobacco Use   Smoking status: Former    Current packs/day: 0.00  Types: Cigarettes    Quit date: 2016    Years since quitting: 9.9   Smokeless tobacco: Never  Vaping Use   Vaping status: Never Used  Substance and Sexual Activity   Alcohol use: Never   Drug use: Never   Sexual activity: Not on file  Other Topics Concern   Not on file  Social History Narrative   Not on file   Social Drivers of Health   Financial Resource Strain: Low Risk  (03/10/2024)   Received from [facility]   Overall Financial Resource Strain (CARDIA)    Difficulty of Paying Living Expenses: Not hard at all  Food Insecurity: No Food Insecurity  (03/10/2024)   Received from [facility]   Hunger Vital Sign    Within the past 12 months, you worried that your food would run out before you got the money to buy more.: Never true    Within the past 12 months, the food you bought just didn't last and you didn't have money to get more.: Never true  Transportation Needs: No Transportation Needs (03/10/2024)   Received from [facility] - Transportation    In the past 12 months, has lack of transportation kept you from medical appointments or from getting medications?: No    Lack of Transportation (Non-Medical): No  Physical Activity: Not on file  Stress: Not on file  Social Connections: Not on file  Intimate Partner Violence: Not on file    Review of Systems: See HPI, otherwise negative ROS  Physical Exam: BP 116/70   Pulse 88   Temp (!) 97.3 F (36.3 C) (Temporal)   Resp 18   Ht 5' 2.5 (1.588 m)   Wt 88.5 kg   SpO2 96%   BMI 35.10 kg/m  General:   Alert,  pleasant and cooperative in NAD Head:  Normocephalic and atraumatic. Neck:  Supple; no masses or thyromegaly. Lungs:  Clear throughout to auscultation, normal respiratory effort.    Heart:  +S1, +S2, Regular rate and rhythm, No edema. Abdomen:  Soft, nontender and nondistended. Normal bowel sounds, without guarding, and without rebound.   Neurologic:  Alert and  oriented x4;  grossly normal neurologically.  Impression/Plan: Shannon Cortez is here for an endoscopy  to be performed for  evaluation of Dysphagia    Risks, benefits, limitations, and alternatives regarding endoscopy have been reviewed with the patient.  Questions have been answered.  All parties agreeable.   Ruel Kung, MD  04/11/2024, 8:16 AM

## 2024-04-12 LAB — SURGICAL PATHOLOGY

## 2024-04-18 ENCOUNTER — Ambulatory Visit: Payer: Self-pay | Admitting: Gastroenterology

## 2024-06-29 ENCOUNTER — Ambulatory Visit: Admit: 2024-06-29 | Admitting: Gastroenterology

## 2024-06-30 ENCOUNTER — Encounter: Admitting: Nurse Practitioner
